# Patient Record
Sex: Female | Born: 1995 | Race: White | Hispanic: No | Marital: Single | Smoking: Never smoker
Health system: Southern US, Community
[De-identification: ages and names within clinical notes are randomized; demographics above are authoritative.]

## PROBLEM LIST (undated history)

## (undated) DIAGNOSIS — M359 Systemic involvement of connective tissue, unspecified: Secondary | ICD-10-CM

## (undated) HISTORY — DX: Systemic involvement of connective tissue, unspecified: M35.9

---
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
## 2018-10-29 NOTE — Progress Notes (Signed)
Office Visit Note  Patient: Tiffany Mercer             Date of Birth: 1996-02-13           MRN: [id number]             PCP: Loraine Leriche., MD Referring: Nickola Major Visit Date: 11/09/2018 Occupation: Senior in college  Subjective:  Positive ANA and joint pain.  History of Present Illness: Tiffany Mercer is a 23 y.o. female seen in consultation per request of her PCP.  According to patient she started having hand stiffness and discomfort in December 2019.  Over time she started having lower back pain and right shoulder joint pain.  She states lower back pain could be worse at time that she has difficulty sleeping and getting up from the chair.  Right shoulder has been hurting off and on sometimes the pain could be severe.  She does not recall any activities which cause the discomfort.  She states she was taking over-the-counter anti-inflammatories initially and then she was taking some prescription anti-inflammatories but it did not help.  She has some recent labs by her PCP which were positive for ANA and she was referred here.  She states her mother has fibromyalgia, Raynauds and Sjogren's syndrome.  She denies any history of oral ulcers, nasal ulcers or photosensitivity.  She states her hands get cold easily and sometimes will turn pale in the colder temperatures.  Activities of Daily Living:  Patient reports morning stiffness for 1 hour.   Patient Reports nocturnal pain. LBP Difficulty dressing/grooming: Denies Difficulty climbing stairs: Denies Difficulty getting out of chair: Reports LBP Difficulty using hands for taps, buttons, cutlery, and/or writing: Denies  Review of Systems  Constitutional: Negative for fatigue, night sweats, weight gain and weight loss.  HENT: Negative for mouth sores, trouble swallowing, trouble swallowing, mouth dryness and nose dryness.   Eyes: Negative for pain, redness, itching, visual disturbance and dryness.  Respiratory:  Negative for cough, shortness of breath, wheezing and difficulty breathing.   Cardiovascular: Negative for chest pain, palpitations, hypertension, irregular heartbeat and swelling in legs/feet.  Gastrointestinal: Negative for abdominal pain, blood in stool, constipation and diarrhea.  Endocrine: Negative for increased urination.  Genitourinary: Negative for painful urination and vaginal dryness.  Musculoskeletal: Positive for arthralgias, joint pain and morning stiffness. Negative for joint swelling, myalgias, muscle weakness, muscle tenderness and myalgias.  Skin: Positive for color change. Negative for rash, hair loss, redness, skin tightness, ulcers and sensitivity to sunlight.  Allergic/Immunologic: Negative for susceptible to infections.  Neurological: Negative for dizziness, light-headedness, headaches, memory loss, night sweats and weakness.  Hematological: Negative for bruising/bleeding tendency and swollen glands.  Psychiatric/Behavioral: Positive for sleep disturbance. Negative for depressed mood and confusion. The patient is nervous/anxious.     PMFS History:  Patient Active Problem List   Diagnosis Date Noted  . History of iron deficiency anemia 11/09/2018  . History of anxiety 11/09/2018    History reviewed. No pertinent past medical history.  Family History  Problem Relation Age of Onset  . Fibromyalgia Mother   . Raynaud syndrome Mother   . Sjogren's syndrome Mother    Past Surgical History:  Procedure Laterality Date  . CHOLECYSTECTOMY  2016  . WISDOM TOOTH EXTRACTION  2018   Social History   Social History Narrative  . Not on file    There is no immunization history on file for this patient.   Objective: Vital Signs: BP 123/88 (  BP Location: Right Arm, Patient Position: Sitting, Cuff Size: Normal)   Pulse 81   Resp 12   Ht 5' 10"  (1.778 m)   Wt 238 lb (108 kg)   BMI 34.15 kg/m    Physical Exam Vitals signs and nursing note reviewed.  Constitutional:       Appearance: She is well-developed.  HENT:     Head: Normocephalic and atraumatic.  Eyes:     Conjunctiva/sclera: Conjunctivae normal.  Neck:     Musculoskeletal: Normal range of motion.  Cardiovascular:     Rate and Rhythm: Normal rate and regular rhythm.     Heart sounds: Normal heart sounds.  Pulmonary:     Effort: Pulmonary effort is normal.     Breath sounds: Normal breath sounds.  Abdominal:     General: Bowel sounds are normal.     Palpations: Abdomen is soft.  Lymphadenopathy:     Cervical: No cervical adenopathy.  Skin:    General: Skin is warm and dry.     Capillary Refill: Capillary refill takes less than 2 seconds.  Neurological:     Mental Status: She is alert and oriented to person, place, and time.  Psychiatric:        Behavior: Behavior normal.      Musculoskeletal Exam: C-spine thoracic and lumbar spine were in good range of motion.  No SI joint tenderness was noted.  She had discomfort with range of motion of her lumbar spine.  She had painful range of motion of her right shoulder joint with full range of motion.  Elbow joints wrist joint MCPs PIPs DIPs been good range of motion with no synovitis or tenderness.  Hip joints, knee joints, ankles MTPs PIPs been good range of motion with no synovitis.  CDAI Exam: CDAI Score: Not documented Patient Global Assessment: Not documented; Provider Global Assessment: Not documented Swollen: Not documented; Tender: Not documented Joint Exam   Not documented   There is currently no information documented on the homunculus. Go to the Rheumatology activity and complete the homunculus joint exam.  Investigation: No additional findings.  Imaging: Xr Hand 2 View Left  Result Date: 11/09/2018 No MCP, PIP or DIP narrowing was noted.  No intercarpal or radiocarpal joint space narrowing was noted.  No erosive changes were noted. Impression: Unremarkable x-ray of the hand.  Xr Hand 2 View Right  Result Date: 11/09/2018  No MCP, PIP or DIP narrowing was noted.  No intercarpal or radiocarpal joint space narrowing was noted.  No erosive changes were noted. Impression: Unremarkable x-ray of the hand.  Xr Lumbar Spine 2-3 Views  Result Date: 11/09/2018 Mild dextroscoliosis was noted.  No SI joint sclerosis was noted.  No disc space narrowing or facet joint arthropathy was noted. Impression: Unremarkable x-ray of the lumbar spine except for mild scoliosis.  Xr Shoulder Right  Result Date: 11/09/2018 No glenohumeral or acromioclavicular joint space narrowing was noted.  No chondrocalcinosis was noted. Impression: Unremarkable x-ray of the shoulder joint.   Recent Labs: No results found for: WBC, HGB, PLT, NA, K, CL, CO2, GLUCOSE, BUN, CREATININE, BILITOT, ALKPHOS, AST, ALT, PROT, ALBUMIN, CALCIUM, GFRAA, QFTBGOLD, QFTBGOLDPLUS  Speciality Comments: No specialty comments available.  Procedures:  No procedures performed Allergies: Patient has no known allergies.   Assessment / Plan:     Visit Diagnoses: Positive ANA (antinuclear antibody) - 08/23/18: RF-, ANA 1:320 speckled, CRP 6.7 -patient complains of pain and stiffness in her hands.  No synovitis was noted today.  She has mild hypermobility in her joints.  She also complains of right shoulder and lower back pain.  She complains of mild Raynaud's symptoms.  I will obtain following labs to evaluate this further.  Is also positive family history of autoimmune disease in her mother.  Plan: CBC with Differential/Platelet, COMPLETE METABOLIC PANEL WITH GFR, Urinalysis, Routine w reflex microscopic, Sedimentation rate, ANA, Anti-scleroderma antibody, RNP Antibody, Anti-Smith antibody, Sjogrens syndrome-B extractable nuclear antibody, Anti-DNA antibody, double-stranded, Sjogrens syndrome-A extractable nuclear antibody, C3 and C4, Beta-2 glycoprotein antibodies, Cardiolipin antibodies, IgG, IgM, IgA, Lupus Anticoagulant Eval w/Reflex  Chronic right shoulder pain -she  complains of severe right shoulder joint discomfort and nocturnal pain in her shoulder.  Plan: XR Shoulder Right.  The x-ray of the shoulder joint was unremarkable.  A handout on shoulder joint exercises was given.  Pain in both hands -she has a stiffness and discomfort in her hands.  No synovitis was noted.  No tenderness was noted on examination today.  Plan: XR Hand 2 View Right, XR Hand 2 View Left.  The x-ray of bilateral hands were unremarkable.  Chronic midline low back pain without sciatica -she complains of lower back pain without any radiculopathy.  She states the pain can be severe to cause nocturnal discomfort.  Plan: XR Lumbar Spine 2-3 Views.  The x-ray showed mild scoliosis otherwise unremarkable.  A handout on back exercises was given.  I will also obtain HLA-B27.  Family history of autoimmune disorder - sjogrens in her mother and rainouts phenomenon.  History of iron deficiency anemia  History of anxiety  Primary insomnia  Other fatigue - Plan: CBC with Differential/Platelet, COMPLETE METABOLIC PANEL WITH GFR, CK, TSH, Glucose 6 phosphate dehydrogenase   Orders: Orders Placed This Encounter  Procedures  . XR Shoulder Right  . XR Lumbar Spine 2-3 Views  . XR Hand 2 View Right  . XR Hand 2 View Left  . CBC with Differential/Platelet  . COMPLETE METABOLIC PANEL WITH GFR  . Urinalysis, Routine w reflex microscopic  . CK  . TSH  . Sedimentation rate  . ANA  . Anti-scleroderma antibody  . RNP Antibody  . Anti-Smith antibody  . Sjogrens syndrome-B extractable nuclear antibody  . Anti-DNA antibody, double-stranded  . Sjogrens syndrome-A extractable nuclear antibody  . C3 and C4  . Beta-2 glycoprotein antibodies  . Cardiolipin antibodies, IgG, IgM, IgA  . Lupus Anticoagulant Eval w/Reflex  . Glucose 6 phosphate dehydrogenase  . HLA-B27 antigen   No orders of the defined types were placed in this encounter.   Face-to-face time spent with patient was 50 minutes.  Greater than 50% of time was spent in counseling and coordination of care.  Follow-Up Instructions: Return for Pain in multiple joints and positive ANA.   Bo Merino, MD  Note - This record has been created using Editor, commissioning.  Chart creation errors have been sought, but may not always  have been located. Such creation errors do not reflect on  the standard of medical care.

## 2018-11-09 ENCOUNTER — Other Ambulatory Visit: Payer: Self-pay

## 2018-11-09 ENCOUNTER — Ambulatory Visit: Payer: Self-pay

## 2018-11-09 ENCOUNTER — Encounter: Payer: Self-pay | Admitting: Rheumatology

## 2018-11-09 ENCOUNTER — Ambulatory Visit: Payer: BLUE CROSS/BLUE SHIELD | Admitting: Rheumatology

## 2018-11-09 ENCOUNTER — Ambulatory Visit (INDEPENDENT_AMBULATORY_CARE_PROVIDER_SITE_OTHER): Payer: BLUE CROSS/BLUE SHIELD

## 2018-11-09 VITALS — BP 123/88 | HR 81 | Resp 12 | Ht 70.0 in | Wt 238.0 lb

## 2018-11-09 DIAGNOSIS — Z832 Family history of diseases of the blood and blood-forming organs and certain disorders involving the immune mechanism: Secondary | ICD-10-CM

## 2018-11-09 DIAGNOSIS — M545 Low back pain, unspecified: Secondary | ICD-10-CM

## 2018-11-09 DIAGNOSIS — Z862 Personal history of diseases of the blood and blood-forming organs and certain disorders involving the immune mechanism: Secondary | ICD-10-CM | POA: Insufficient documentation

## 2018-11-09 DIAGNOSIS — R768 Other specified abnormal immunological findings in serum: Secondary | ICD-10-CM | POA: Diagnosis not present

## 2018-11-09 DIAGNOSIS — M25542 Pain in joints of left hand: Secondary | ICD-10-CM

## 2018-11-09 DIAGNOSIS — M25511 Pain in right shoulder: Secondary | ICD-10-CM | POA: Diagnosis not present

## 2018-11-09 DIAGNOSIS — F5101 Primary insomnia: Secondary | ICD-10-CM

## 2018-11-09 DIAGNOSIS — G8929 Other chronic pain: Secondary | ICD-10-CM | POA: Diagnosis not present

## 2018-11-09 DIAGNOSIS — M79641 Pain in right hand: Secondary | ICD-10-CM | POA: Diagnosis not present

## 2018-11-09 DIAGNOSIS — R5383 Other fatigue: Secondary | ICD-10-CM

## 2018-11-09 DIAGNOSIS — M25541 Pain in joints of right hand: Secondary | ICD-10-CM | POA: Diagnosis not present

## 2018-11-09 DIAGNOSIS — Z8659 Personal history of other mental and behavioral disorders: Secondary | ICD-10-CM | POA: Insufficient documentation

## 2018-11-09 DIAGNOSIS — M79642 Pain in left hand: Secondary | ICD-10-CM

## 2018-11-09 NOTE — Patient Instructions (Signed)
Shoulder Exercises  Ask your health care provider which exercises are safe for you. Do exercises exactly as told by your health care provider and adjust them as directed. It is normal to feel mild stretching, pulling, tightness, or discomfort as you do these exercises, but you should stop right away if you feel sudden pain or your pain gets worse.Do not begin these exercises until told by your health care provider.  Range of Motion Exercises              These exercises warm up your muscles and joints and improve the movement and flexibility of your shoulder. These exercises also help to relieve pain, numbness, and tingling. These exercises involve stretching your injured shoulder directly.  Exercise A: Pendulum  1. Stand near a wall or a surface that you can hold onto for balance.  2. Bend at the waist and let your left / right arm hang straight down. Use your other arm to support you. Keep your back straight and do not lock your knees.  3. Relax your left / right arm and shoulder muscles, and move your hips and your trunk so your left / right arm swings freely. Your arm should swing because of the motion of your body, not because you are using your arm or shoulder muscles.  4. Keep moving your body so your arm swings in the following directions, as told by your health care provider:  ? Side to side.  ? Forward and backward.  ? In clockwise and counterclockwise circles.  5. Continue each motion for __________ seconds, or for as long as told by your health care provider.  6. Slowly return to the starting position.  Repeat __________ times. Complete this exercise __________ times a day.  Exercise B:Flexion, Standing  1. Stand and hold a broomstick, a cane, or a similar object. Place your hands a little more than shoulder-width apart on the object. Your left / right hand should be palm-up, and your other hand should be palm-down.  2. Keep your elbow straight and keep your shoulder muscles relaxed. Push the stick  down with your healthy arm to raise your left / right arm in front of your body, and then over your head until you feel a stretch in your shoulder.  ? Avoid shrugging your shoulder while you raise your arm. Keep your shoulder blade tucked down toward the middle of your back.  3. Hold for __________ seconds.  4. Slowly return to the starting position.  Repeat __________ times. Complete this exercise __________ times a day.  Exercise C: Abduction, Standing  1. Stand and hold a broomstick, a cane, or a similar object. Place your hands a little more than shoulder-width apart on the object. Your left / right hand should be palm-up, and your other hand should be palm-down.  2. While keeping your elbow straight and your shoulder muscles relaxed, push the stick across your body toward your left / right side. Raise your left / right arm to the side of your body and then over your head until you feel a stretch in your shoulder.  ? Do not raise your arm above shoulder height, unless your health care provider tells you to do that.  ? Avoid shrugging your shoulder while you raise your arm. Keep your shoulder blade tucked down toward the middle of your back.  3. Hold for __________ seconds.  4. Slowly return to the starting position.  Repeat __________ times. Complete this exercise __________ times a   day.  Exercise D:Internal Rotation  1. Place your left / right hand behind your back, palm-up.  2. Use your other hand to dangle an exercise band, a towel, or a similar object over your shoulder. Grasp the band with your left / right hand so you are holding onto both ends.  3. Gently pull up on the band until you feel a stretch in the front of your left / right shoulder.  ? Avoid shrugging your shoulder while you raise your arm. Keep your shoulder blade tucked down toward the middle of your back.  4. Hold for __________ seconds.  5. Release the stretch by letting go of the band and lowering your hands.  Repeat __________ times.  Complete this exercise __________ times a day.  Stretching Exercises    These exercises warm up your muscles and joints and improve the movement and flexibility of your shoulder. These exercises also help to relieve pain, numbness, and tingling. These exercises are done using your healthy shoulder to help stretch the muscles of your injured shoulder.  Exercise E: Corner Stretch (External Rotation and Abduction)  1. Stand in a doorway with one of your feet slightly in front of the other. This is called a staggered stance. If you cannot reach your forearms to the door frame, stand facing a corner of a room.  2. Choose one of the following positions as told by your health care provider:  ? Place your hands and forearms on the door frame above your head.  ? Place your hands and forearms on the door frame at the height of your head.  ? Place your hands on the door frame at the height of your elbows.  3. Slowly move your weight onto your front foot until you feel a stretch across your chest and in the front of your shoulders. Keep your head and chest upright and keep your abdominal muscles tight.  4. Hold for __________ seconds.  5. To release the stretch, shift your weight to your back foot.  Repeat __________ times. Complete this stretch __________ times a day.  Exercise F:Extension, Standing  1. Stand and hold a broomstick, a cane, or a similar object behind your back.  ? Your hands should be a little wider than shoulder-width apart.  ? Your palms should face away from your back.  2. Keeping your elbows straight and keeping your shoulder muscles relaxed, move the stick away from your body until you feel a stretch in your shoulder.  ? Avoid shrugging your shoulders while you move the stick. Keep your shoulder blade tucked down toward the middle of your back.  3. Hold for __________ seconds.  4. Slowly return to the starting position.  Repeat __________ times. Complete this exercise __________ times a  day.  Strengthening Exercises                   These exercises build strength and endurance in your shoulder. Endurance is the ability to use your muscles for a long time, even after they get tired.  Exercise G:External Rotation  1. Sit in a stable chair without armrests.  2. Secure an exercise band at elbow height on your left / right side.  3. Place a soft object, such as a folded towel or a small pillow, between your left / right upper arm and your body to move your elbow a few inches away (about 10 cm) from your side.  4. Hold the end of the band so it   is tight and there is no slack.  5. Keeping your elbow pressed against the soft object, move your left / right forearm out, away from your abdomen. Keep your body steady so only your forearm moves.  6. Hold for __________ seconds.  7. Slowly return to the starting position.  Repeat __________ times. Complete this exercise __________ times a day.  Exercise H:Shoulder Abduction  1. Sit in a stable chair without armrests, or stand.  2. Hold a __________ weight in your left / right hand, or hold an exercise band with both hands.  3. Start with your arms straight down and your left / right palm facing in, toward your body.  4. Slowly lift your left / right hand out to your side. Do not lift your hand above shoulder height unless your health care provider tells you that this is safe.  ? Keep your arms straight.  ? Avoid shrugging your shoulder while you do this movement. Keep your shoulder blade tucked down toward the middle of your back.  5. Hold for __________ seconds.  6. Slowly lower your arm, and return to the starting position.  Repeat __________ times. Complete this exercise __________ times a day.  Exercise I:Shoulder Extension  1. Sit in a stable chair without armrests, or stand.  2. Secure an exercise band to a stable object in front of you where it is at shoulder height.  3. Hold one end of the exercise band in each hand. Your palms should face each  other.  4. Straighten your elbows and lift your hands up to shoulder height.  5. Step back, away from the secured end of the exercise band, until the band is tight and there is no slack.  6. Squeeze your shoulder blades together as you pull your hands down to the sides of your thighs. Stop when your hands are straight down by your sides. Do not let your hands go behind your body.  7. Hold for __________ seconds.  8. Slowly return to the starting position.  Repeat __________ times. Complete this exercise __________ times a day.  Exercise J:Standing Shoulder Row  1. Sit in a stable chair without armrests, or stand.  2. Secure an exercise band to a stable object in front of you so it is at waist height.  3. Hold one end of the exercise band in each hand. Your palms should be in a thumbs-up position.  4. Bend each of your elbows to an "L" shape (about 90 degrees) and keep your upper arms at your sides.  5. Step back until the band is tight and there is no slack.  6. Slowly pull your elbows back behind you.  7. Hold for __________ seconds.  8. Slowly return to the starting position.  Repeat __________ times. Complete this exercise __________ times a day.  Exercise K:Shoulder Press-Ups  1. Sit in a stable chair that has armrests. Sit upright, with your feet flat on the floor.  2. Put your hands on the armrests so your elbows are bent and your fingers are pointing forward. Your hands should be about even with the sides of your body.  3. Push down on the armrests and use your arms to lift yourself off of the chair. Straighten your elbows and lift yourself up as much as you comfortably can.  ? Move your shoulder blades down, and avoid letting your shoulders move up toward your ears.  ? Keep your feet on the ground. As you get stronger, your   feet should support less of your body weight as you lift yourself up.  4. Hold for __________ seconds.  5. Slowly lower yourself back into the chair.  Repeat __________ times. Complete  this exercise __________ times a day.  Exercise L: Wall Push-Ups  1. Stand so you are facing a stable wall. Your feet should be about one arm-length away from the wall.  2. Lean forward and place your palms on the wall at shoulder height.  3. Keep your feet flat on the floor as you bend your elbows and lean forward toward the wall.  4. Hold for __________ seconds.  5. Straighten your elbows to push yourself back to the starting position.  Repeat __________ times. Complete this exercise __________ times a day.  This information is not intended to replace advice given to you by your health care provider. Make sure you discuss any questions you have with your health care provider.  Document Released: 04/16/2005 Document Revised: 10/06/2017 Document Reviewed: 02/11/2015  Elsevier Interactive Patient Education  2019 [employer]          Back Exercises  The following exercises strengthen the muscles that help to support the back. They also help to keep the lower back flexible. Doing these exercises can help to prevent back pain or lessen existing pain.  If you have back pain or discomfort, try doing these exercises 2-3 times each day or as told by your health care provider. When the pain goes away, do them once each day, but increase the number of times that you repeat the steps for each exercise (do more repetitions). If you do not have back pain or discomfort, do these exercises once each day or as told by your health care provider.  Exercises  Single Knee to Chest  Repeat these steps 3-5 times for each leg:  1. Lie on your back on a firm bed or the floor with your legs extended.  2. Bring one knee to your chest. Your other leg should stay extended and in contact with the floor.  3. Hold your knee in place by grabbing your knee or thigh.  4. Pull on your knee until you feel a gentle stretch in your lower back.  5. Hold the stretch for 10-30 seconds.  6. Slowly release and straighten your leg.  Pelvic Tilt  Repeat  these steps 5-10 times:  1. Lie on your back on a firm bed or the floor with your legs extended.  2. Bend your knees so they are pointing toward the ceiling and your feet are flat on the floor.  3. Tighten your lower abdominal muscles to press your lower back against the floor. This motion will tilt your pelvis so your tailbone points up toward the ceiling instead of pointing to your feet or the floor.  4. With gentle tension and even breathing, hold this position for 5-10 seconds.  Cat-Cow  Repeat these steps until your lower back becomes more flexible:  1. Get into a hands-and-knees position on a firm surface. Keep your hands under your shoulders, and keep your knees under your hips. You may place padding under your knees for comfort.  2. Let your head hang down, and point your tailbone toward the floor so your lower back becomes rounded like the back of a cat.  3. Hold this position for 5 seconds.  4. Slowly lift your head and point your tailbone up toward the ceiling so your back forms a sagging arch like the back   of a cow.  5. Hold this position for 5 seconds.    Press-Ups  Repeat these steps 5-10 times:  1. Lie on your abdomen (face-down) on the floor.  2. Place your palms near your head, about shoulder-width apart.  3. While you keep your back as relaxed as possible and keep your hips on the floor, slowly straighten your arms to raise the top half of your body and lift your shoulders. Do not use your back muscles to raise your upper torso. You may adjust the placement of your hands to make yourself more comfortable.  4. Hold this position for 5 seconds while you keep your back relaxed.  5. Slowly return to lying flat on the floor.    Bridges  Repeat these steps 10 times:  1. Lie on your back on a firm surface.  2. Bend your knees so they are pointing toward the ceiling and your feet are flat on the floor.  3. Tighten your buttocks muscles and lift your buttocks off of the floor until your waist is at almost  the same height as your knees. You should feel the muscles working in your buttocks and the back of your thighs. If you do not feel these muscles, slide your feet 1-2 inches farther away from your buttocks.  4. Hold this position for 3-5 seconds.  5. Slowly lower your hips to the starting position, and allow your buttocks muscles to relax completely.  If this exercise is too easy, try doing it with your arms crossed over your chest.  Abdominal Crunches  Repeat these steps 5-10 times:  1. Lie on your back on a firm bed or the floor with your legs extended.  2. Bend your knees so they are pointing toward the ceiling and your feet are flat on the floor.  3. Cross your arms over your chest.  4. Tip your chin slightly toward your chest without bending your neck.  5. Tighten your abdominal muscles and slowly raise your trunk (torso) high enough to lift your shoulder blades a tiny bit off of the floor. Avoid raising your torso higher than that, because it can put too much stress on your low back and it does not help to strengthen your abdominal muscles.  6. Slowly return to your starting position.  Back Lifts  Repeat these steps 5-10 times:  1. Lie on your abdomen (face-down) with your arms at your sides, and rest your forehead on the floor.  2. Tighten the muscles in your legs and your buttocks.  3. Slowly lift your chest off of the floor while you keep your hips pressed to the floor. Keep the back of your head in line with the curve in your back. Your eyes should be looking at the floor.  4. Hold this position for 3-5 seconds.  5. Slowly return to your starting position.  Contact a health care provider if:   Your back pain or discomfort gets much worse when you do an exercise.   Your back pain or discomfort does not lessen within 2 hours after you exercise.  If you have any of these problems, stop doing these exercises right away. Do not do them again unless your health care provider says that you can.  Get help right  away if:   You develop sudden, severe back pain. If this happens, stop doing the exercises right away. Do not do them again unless your health care provider says that you can.  This information is   not intended to replace advice given to you by your health care provider. Make sure you discuss any questions you have with your health care provider.  Document Released: 07/10/2004 Document Revised: 10/06/2017 Document Reviewed: 07/27/2014  Elsevier Interactive Patient Education  2019 [employer]

## 2018-11-10 NOTE — Progress Notes (Signed)
We will discuss at the follow-up visit.  Please a schedule an earlier appointment if possible.

## 2018-11-11 NOTE — Progress Notes (Signed)
Please schedule an earlier appointment after all the lab results are available.

## 2018-11-12 LAB — CARDIOLIPIN ANTIBODIES, IGG, IGM, IGA
Anticardiolipin IgA: <11 [APL'U]
Anticardiolipin IgG: <14 [GPL'U]
Anticardiolipin IgM: <12 [MPL'U]

## 2018-11-12 LAB — CBC WITH DIFFERENTIAL/PLATELET
Absolute Monocytes: 420 cells/uL (ref 200–950)
Basophils Absolute: 39 cells/uL (ref 0–200)
Basophils Relative: 0.7 %
Eosinophils Absolute: 168 cells/uL (ref 15–500)
Eosinophils Relative: 3 %
HCT: 40.7 % (ref 35.0–45.0)
Hemoglobin: 13.3 g/dL (ref 11.7–15.5)
Lymphs Abs: 1686 cells/uL (ref 850–3900)
MCH: 28.2 pg (ref 27.0–33.0)
MCHC: 32.7 g/dL (ref 32.0–36.0)
MCV: 86.2 fL (ref 80.0–100.0)
MPV: 10.3 fL (ref 7.5–12.5)
Monocytes Relative: 7.5 %
Neutro Abs: 3287 cells/uL (ref 1500–7800)
Neutrophils Relative %: 58.7 %
Platelets: 277 10*3/uL (ref 140–400)
RBC: 4.72 10*6/uL (ref 3.80–5.10)
RDW: 12.4 % (ref 11.0–15.0)
Total Lymphocyte: 30.1 %
WBC: 5.6 10*3/uL (ref 3.8–10.8)

## 2018-11-12 LAB — COMPLETE METABOLIC PANEL WITH GFR
AG Ratio: 1.6 (calc) (ref 1.0–2.5)
ALT: 22 U/L (ref 6–29)
AST: 16 U/L (ref 10–30)
Albumin: 4.2 g/dL (ref 3.6–5.1)
Alkaline phosphatase (APISO): 65 U/L (ref 31–125)
BUN: 9 mg/dL (ref 7–25)
CO2: 27 mmol/L (ref 20–32)
Calcium: 9 mg/dL (ref 8.6–10.2)
Chloride: 107 mmol/L (ref 98–110)
Creat: 0.86 mg/dL (ref 0.50–1.10)
GFR, Est African American: 112 mL/min/{1.73_m2} (ref >=60)
GFR, Est Non African American: 97 mL/min/{1.73_m2} (ref >=60)
Globulin: 2.7 g/dL (calc) (ref 1.9–3.7)
Glucose, Bld: 88 mg/dL (ref 65–99)
Potassium: 4 mmol/L (ref 3.5–5.3)
Sodium: 140 mmol/L (ref 135–146)
Total Bilirubin: 0.2 mg/dL (ref 0.2–1.2)
Total Protein: 6.9 g/dL (ref 6.1–8.1)

## 2018-11-12 LAB — URINALYSIS, ROUTINE W REFLEX MICROSCOPIC
Bacteria, UA: NONE SEEN /HPF
Bilirubin Urine: NEGATIVE
Glucose, UA: NEGATIVE
Hgb urine dipstick: NEGATIVE
Hyaline Cast: NONE SEEN /LPF
Ketones, ur: NEGATIVE
Leukocytes,Ua: NEGATIVE
Nitrite: NEGATIVE
Specific Gravity, Urine: 1.029 (ref 1.001–1.03)
WBC, UA: NONE SEEN /HPF (ref 0–5)
pH: 5.5 (ref 5.0–8.0)

## 2018-11-12 LAB — ANTI-NUCLEAR AB-TITER (ANA TITER)
ANA TITER: 1:320 {titer} — ABNORMAL HIGH
ANA Titer 1: 1:40 {titer} — ABNORMAL HIGH

## 2018-11-12 LAB — RNP ANTIBODY: Ribonucleic Protein(ENA) Antibody, IgG: 2.5 AI — AB

## 2018-11-12 LAB — RFX DRVVT 1:1 MIX

## 2018-11-12 LAB — BETA-2 GLYCOPROTEIN ANTIBODIES
Beta-2 Glyco 1 IgA: <9 SAU (ref <=20)
Beta-2 Glyco 1 IgM: <9 SMU (ref <=20)
Beta-2 Glyco I IgG: <9 SGU (ref <=20)

## 2018-11-12 LAB — ANA: Anti Nuclear Antibody (ANA): POSITIVE — AB

## 2018-11-12 LAB — RFLX DRVVT CONFRIM: DRVVT CONFIRM: POSITIVE — AB

## 2018-11-12 LAB — RFLX HEXAGONAL PHASE CONFIRM: Hexagonal Phase Conf: NEGATIVE

## 2018-11-12 LAB — SJOGRENS SYNDROME-B EXTRACTABLE NUCLEAR ANTIBODY: SSB (La) (ENA) Antibody, IgG: <1 AI

## 2018-11-12 LAB — SJOGRENS SYNDROME-A EXTRACTABLE NUCLEAR ANTIBODY: SSA (Ro) (ENA) Antibody, IgG: <1 AI

## 2018-11-12 LAB — LUPUS ANTICOAGULANT EVAL W/ REFLEX
PTT-LA Screen: 52 s — ABNORMAL HIGH (ref <=40)
dRVVT: 53 s — ABNORMAL HIGH (ref <=45)

## 2018-11-12 LAB — CK: Total CK: 63 U/L (ref 29–143)

## 2018-11-12 LAB — ANTI-DNA ANTIBODY, DOUBLE-STRANDED: ds DNA Ab: 9 IU/mL — ABNORMAL HIGH

## 2018-11-12 LAB — ANTI-SCLERODERMA ANTIBODY: Scleroderma (Scl-70) (ENA) Antibody, IgG: <1 AI

## 2018-11-12 LAB — SEDIMENTATION RATE: Sed Rate: 25 mm/h — ABNORMAL HIGH (ref 0–20)

## 2018-11-12 LAB — HLA-B27 ANTIGEN: HLA-B27 Antigen: NEGATIVE

## 2018-11-12 LAB — ANTI-SMITH ANTIBODY: ENA SM Ab Ser-aCnc: <1 AI

## 2018-11-12 LAB — TSH: TSH: 1.83 mIU/L

## 2018-11-12 LAB — C3 AND C4
C3 Complement: 146 mg/dL (ref 83–193)
C4 Complement: 27 mg/dL (ref 15–57)

## 2018-11-12 LAB — GLUCOSE 6 PHOSPHATE DEHYDROGENASE: G-6PDH: 16.8 U/g Hgb (ref 7.0–20.5)

## 2018-11-17 ENCOUNTER — Other Ambulatory Visit: Payer: Self-pay

## 2018-11-17 ENCOUNTER — Encounter: Payer: Self-pay | Admitting: Rheumatology

## 2018-11-17 ENCOUNTER — Ambulatory Visit: Payer: BLUE CROSS/BLUE SHIELD | Admitting: Rheumatology

## 2018-11-17 VITALS — BP 121/85 | HR 87 | Resp 13 | Ht 70.0 in | Wt 239.0 lb

## 2018-11-17 DIAGNOSIS — M2550 Pain in unspecified joint: Secondary | ICD-10-CM | POA: Diagnosis not present

## 2018-11-17 DIAGNOSIS — Z832 Family history of diseases of the blood and blood-forming organs and certain disorders involving the immune mechanism: Secondary | ICD-10-CM | POA: Diagnosis not present

## 2018-11-17 DIAGNOSIS — F5101 Primary insomnia: Secondary | ICD-10-CM

## 2018-11-17 DIAGNOSIS — Z862 Personal history of diseases of the blood and blood-forming organs and certain disorders involving the immune mechanism: Secondary | ICD-10-CM

## 2018-11-17 DIAGNOSIS — G8929 Other chronic pain: Secondary | ICD-10-CM

## 2018-11-17 DIAGNOSIS — R5383 Other fatigue: Secondary | ICD-10-CM

## 2018-11-17 DIAGNOSIS — M359 Systemic involvement of connective tissue, unspecified: Secondary | ICD-10-CM

## 2018-11-17 DIAGNOSIS — Z79899 Other long term (current) drug therapy: Secondary | ICD-10-CM

## 2018-11-17 DIAGNOSIS — Z8659 Personal history of other mental and behavioral disorders: Secondary | ICD-10-CM

## 2018-11-17 DIAGNOSIS — M545 Low back pain: Secondary | ICD-10-CM | POA: Diagnosis not present

## 2018-11-17 MED ORDER — HYDROXYCHLOROQUINE SULFATE 200 MG PO TABS: 200.0000 mg | ORAL_TABLET | Freq: Two times a day (BID) | ORAL | 0 refills | Status: DC

## 2018-11-17 NOTE — Patient Instructions (Signed)
Standing Labs We placed an order today for your standing lab work.    Please come back and get your standing labs in 1 month, then 3 months, then every 5 months.  We have open lab daily Monday through Thursday from 8:30-12:30 PM and 1:30-4:30 PM and Friday from 8:30-12:30 PM and 1:30 -4:00 PM at the office of Dr. Shaili Deveshwar.   You may experience shorter wait times on Monday and Friday afternoons. The office is located at [address], [address], [place], [place] [address] No appointment is necessary.   Labs are drawn by [facility].  You may receive a bill from [facility] for your lab work.  If you wish to have your labs drawn at another location, please call the office 24 hours in advance to send orders.  If you have any questions regarding directions or hours of operation,  please call [phone].   Just as a reminder please drink plenty of water prior to coming for your lab work. Thanks!  Hydroxychloroquine tablets What is this medicine? HYDROXYCHLOROQUINE (hye drox ee KLOR oh kwin) is used to treat rheumatoid arthritis and systemic lupus erythematosus. It is also used to treat malaria. This medicine may be used for other purposes; ask your health care provider or pharmacist if you have questions. COMMON BRAND NAME(S): Plaquenil, Quineprox What should I tell my health care provider before I take this medicine? They need to know if you have any of these conditions: -diabetes -eye disease, vision problems -G6PD deficiency -history of blood diseases -history of irregular heartbeat -if you often drink alcohol -kidney disease -liver disease -porphyria -psoriasis -seizures -an unusual or allergic reaction to chloroquine, hydroxychloroquine, other medicines, foods, dyes, or preservatives -pregnant or trying to get pregnant -breast-feeding How should I use this medicine? Take this medicine by mouth with a glass of water. Follow the directions on the prescription label. Avoid  taking antacids within 4 hours of taking this medicine. It is best to separate these medicines by at least 4 hours. Do not cut, crush or chew this medicine. You can take it with or without food. If it upsets your stomach, take it with food. Take your medicine at regular intervals. Do not take your medicine more often than directed. Take all of your medicine as directed even if you think you are better. Do not skip doses or stop your medicine early. Talk to your pediatrician regarding the use of this medicine in children. While this drug may be prescribed for selected conditions, precautions do apply. Overdosage: If you think you have taken too much of this medicine contact a poison control center or emergency room at once. NOTE: This medicine is only for you. Do not share this medicine with others. What if I miss a dose? If you miss a dose, take it as soon as you can. If it is almost time for your next dose, take only that dose. Do not take double or extra doses. What may interact with this medicine? Do not take this medicine with any of the following medications: -cisapride -dofetilide -dronedarone -live virus vaccines -penicillamine -pimozide -thioridazine -ziprasidone This medicine may also interact with the following medications: -ampicillin -antacids -cimetidine -cyclosporine -digoxin -medicines for diabetes, like insulin, glipizide, glyburide -medicines for seizures like carbamazepine, phenobarbital, phenytoin -mefloquine -methotrexate -other medicines that prolong the QT interval (cause an abnormal heart rhythm) -praziquantel This list may not describe all possible interactions. Give your health care provider a list of all the medicines, herbs, non-prescription drugs, or dietary supplements   you use. Also tell them if you smoke, drink alcohol, or use illegal drugs. Some items may interact with your medicine. What should I watch for while using this medicine? Tell your doctor or  healthcare professional if your symptoms do not start to get better or if they get worse. Avoid taking antacids within 4 hours of taking this medicine. It is best to separate these medicines by at least 4 hours. Tell your doctor or health care professional right away if you have any change in your eyesight. Your vision and blood may be tested before and during use of this medicine. This medicine can make you more sensitive to the sun. Keep out of the sun. If you cannot avoid being in the sun, wear protective clothing and use sunscreen. Do not use sun lamps or tanning beds/booths. What side effects may I notice from receiving this medicine? Side effects that you should report to your doctor or health care professional as soon as possible: -allergic reactions like skin rash, itching or hives, swelling of the face, lips, or tongue -changes in vision -decreased hearing or ringing of the ears -redness, blistering, peeling or loosening of the skin, including inside the mouth -seizures -sensitivity to light -signs and symptoms of a dangerous change in heartbeat or heart rhythm like chest pain; dizziness; fast or irregular heartbeat; palpitations; feeling faint or lightheaded, falls; breathing problems -signs and symptoms of liver injury like dark yellow or brown urine; general ill feeling or flu-like symptoms; light-colored stools; loss of appetite; nausea; right upper belly pain; unusually weak or tired; yellowing of the eyes or skin -signs and symptoms of low blood sugar such as feeling anxious; confusion; dizziness; increased hunger; unusually weak or tired; sweating; shakiness; cold; irritable; headache; blurred vision; fast heartbeat; loss of consciousness -uncontrollable head, mouth, neck, arm, or leg movements Side effects that usually do not require medical attention (report to your doctor or health care professional if they continue or are bothersome): -anxious -diarrhea -dizziness -hair  loss -headache -irritable -loss of appetite -nausea, vomiting -stomach pain This list may not describe all possible side effects. Call your doctor for medical advice about side effects. You may report side effects to FDA at 1-800-FDA-1088. Where should I keep my medicine? Keep out of the reach of children. In children, this medicine can cause overdose with small doses. Store at room temperature between 15 and 30 degrees C (59 and 86 degrees F). Protect from moisture and light. Throw away any unused medicine after the expiration date. NOTE: This sheet is a summary. It may not cover all possible information. If you have questions about this medicine, talk to your doctor, pharmacist, or health care provider.  2019 Elsevier/[employer] Standard (2016-01-16 14:16:15)   

## 2018-11-17 NOTE — Progress Notes (Signed)
Pharmacy Note  Subjective: Patient presents today to the [facility] to see Dr. Corliss Skains.  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  She is naive to therapy.  Objective: CMP     Component Value Date/Time   NA 140 11/09/2018 1024   K 4.0 11/09/2018 1024   CL 107 11/09/2018 1024   CO2 27 11/09/2018 1024   GLUCOSE 88 11/09/2018 1024   BUN 9 11/09/2018 1024   CREATININE 0.86 11/09/2018 1024   CALCIUM 9.0 11/09/2018 1024   PROT 6.9 11/09/2018 1024   AST 16 11/09/2018 1024   ALT 22 11/09/2018 1024   BILITOT 0.2 11/09/2018 1024   GFRNONAA 97 11/09/2018 1024   GFRAA 112 11/09/2018 1024    CBC    Component Value Date/Time   WBC 5.6 11/09/2018 1024   RBC 4.72 11/09/2018 1024   HGB 13.3 11/09/2018 1024   HCT 40.7 11/09/2018 1024   PLT 277 11/09/2018 1024   MCV 86.2 11/09/2018 1024   MCH 28.2 11/09/2018 1024   MCHC 32.7 11/09/2018 1024   RDW 12.4 11/09/2018 1024   LYMPHSABS 1,686 11/09/2018 1024   EOSABS 168 11/09/2018 1024   BASOSABS 39 11/09/2018 1024    Assessment/Plan: Patient was counseled on the purpose, proper use, and adverse effects of hydroxychloroquine including nausea/diarrhea, skin rash, headaches, and sun sensitivity.  Discussed importance of annual eye exams while on hydroxychloroquine to monitor to ocular toxicity and discussed importance of frequent laboratory monitoring.  Provided patient with eye exam form for baseline ophthalmologic exam and standing lab instructions.  Provided patient with educational materials on hydroxychloroquine and answered all questions.  Patient consented to hydroxychloroquine.  Will upload consent in the media tab.    Dose will be Plaquenil 200 mg twice daily based on weight of 108 kg. She is to come back for labs in 1 month, 3 months, then every 5 months.  All questions encouraged and answered.  Instructed patient to call with any further questions or concerns.  Verlin Fester, PharmD, [facility] Rheumatology Clinical Pharmacist  11/17/2018 1:35 PM

## 2018-11-17 NOTE — Progress Notes (Signed)
Office Visit Note  Patient: Tiffany Mercer             Date of Birth: January 28, 1996           MRN: [id number]             PCP: Loraine Leriche., MD Referring: Loraine Leriche.,* Visit Date: 11/17/2018 Occupation: _0 @  Subjective:  Pain in multiple joints and Raynauds.   History of Present Illness: Tiffany Mercer is a 23 y.o. female recently evaluated for Raynauds and arthralgias.  She states she continues to have joint pain and stiffness in her hands and her right shoulder.  She also has been experiencing fatigue hair loss and Raynauds phenomenon.  She states the Raynauds has been better during the summer months.  She continues to have some lower back pain.  Activities of Daily Living:  Patient reports morning stiffness for 45 minutes.   Patient Reports nocturnal pain.  Difficulty dressing/grooming: Denies Difficulty climbing stairs: Denies Difficulty getting out of chair: Denies Difficulty using hands for taps, buttons, cutlery, and/or writing: Reports  Review of Systems  Constitutional: Positive for fatigue. Negative for night sweats, weight gain and weight loss.  HENT: Negative for mouth sores, trouble swallowing, trouble swallowing, mouth dryness and nose dryness.   Eyes: Negative for pain, redness, visual disturbance and dryness.  Respiratory: Negative for cough, shortness of breath and difficulty breathing.   Cardiovascular: Negative for chest pain, palpitations, hypertension, irregular heartbeat and swelling in legs/feet.  Gastrointestinal: Negative for blood in stool, constipation and diarrhea.  Endocrine: Negative for increased urination.  Genitourinary: Negative for vaginal dryness.  Musculoskeletal: Positive for arthralgias, joint pain and morning stiffness. Negative for joint swelling, myalgias, muscle weakness, muscle tenderness and myalgias.  Skin: Positive for color change, hair loss and sensitivity to sunlight. Negative for rash, skin  tightness and ulcers.  Allergic/Immunologic: Negative for susceptible to infections.  Neurological: Negative for dizziness, memory loss, night sweats and weakness.  Hematological: Negative for swollen glands.  Psychiatric/Behavioral: Negative for depressed mood and sleep disturbance. The patient is nervous/anxious.     PMFS History:  Patient Active Problem List   Diagnosis Date Noted  . Family history of autoimmune disorder 11/17/2018  . Autoimmune disease ([place]) 11/17/2018  . History of iron deficiency anemia 11/09/2018  . History of anxiety 11/09/2018    History reviewed. No pertinent past medical history.  Family History  Problem Relation Age of Onset  . Fibromyalgia Mother   . Raynaud syndrome Mother   . Sjogren's syndrome Mother    Past Surgical History:  Procedure Laterality Date  . CHOLECYSTECTOMY  2016  . WISDOM TOOTH EXTRACTION  2018   Social History   Social History Narrative  . Not on file    There is no immunization history on file for this patient.   Objective: Vital Signs: BP 121/85 (BP Location: Left Arm, Patient Position: Sitting, Cuff Size: Normal)   Pulse 87   Resp 13   Ht _1  (1.778 m)   Wt 239 lb (108.4 kg)   BMI 34.29 kg/m    Physical Exam Vitals signs and nursing note reviewed.  Constitutional:      Appearance: She is well-developed.  HENT:     Head: Normocephalic and atraumatic.  Eyes:     Conjunctiva/sclera: Conjunctivae normal.  Neck:     Musculoskeletal: Normal range of motion.  Cardiovascular:     Rate and Rhythm: Normal rate and regular rhythm.     Heart sounds:  Normal heart sounds.  Pulmonary:     Effort: Pulmonary effort is normal.     Breath sounds: Normal breath sounds.  Abdominal:     General: Bowel sounds are normal.     Palpations: Abdomen is soft.  Lymphadenopathy:     Cervical: No cervical adenopathy.  Skin:    General: Skin is warm and dry.     Capillary Refill: Capillary refill takes less than 2 seconds.   Neurological:     Mental Status: She is alert and oriented to person, place, and time.  Psychiatric:        Behavior: Behavior normal.      Musculoskeletal Exam: C-spine thoracic and lumbar spine good range of motion.  She has good range of motion of her shoulder joints elbow joints wrist joint MCPs PIPs DIPs with no synovitis.  Hip joints knee joints ankles MTPs PIPs with good range of motion with no synovitis.  CDAI Exam: CDAI Score: Not documented Patient Global Assessment: Not documented; Provider Global Assessment: Not documented Swollen: Not documented; Tender: Not documented Joint Exam   Not documented   There is currently no information documented on the homunculus. Go to the Rheumatology activity and complete the homunculus joint exam.  Investigation: No additional findings.  Imaging: Xr Hand 2 View Left  Result Date: 11/09/2018 No MCP, PIP or DIP narrowing was noted.  No intercarpal or radiocarpal joint space narrowing was noted.  No erosive changes were noted. Impression: Unremarkable x-ray of the hand.  Xr Hand 2 View Right  Result Date: 11/09/2018 No MCP, PIP or DIP narrowing was noted.  No intercarpal or radiocarpal joint space narrowing was noted.  No erosive changes were noted. Impression: Unremarkable x-ray of the hand.  Xr Lumbar Spine 2-3 Views  Result Date: 11/09/2018 Mild dextroscoliosis was noted.  No SI joint sclerosis was noted.  No disc space narrowing or facet joint arthropathy was noted. Impression: Unremarkable x-ray of the lumbar spine except for mild scoliosis.  Xr Shoulder Right  Result Date: 11/09/2018 No glenohumeral or acromioclavicular joint space narrowing was noted.  No chondrocalcinosis was noted. Impression: Unremarkable x-ray of the shoulder joint.   Recent Labs: Lab Results  Component Value Date   WBC 5.6 11/09/2018   HGB 13.3 11/09/2018   PLT 277 11/09/2018   NA 140 11/09/2018   K 4.0 11/09/2018   CL 107 11/09/2018   CO2 27  11/09/2018   GLUCOSE 88 11/09/2018   BUN 9 11/09/2018   CREATININE 0.86 11/09/2018   BILITOT 0.2 11/09/2018   AST 16 11/09/2018   ALT 22 11/09/2018   PROT 6.9 11/09/2018   CALCIUM 9.0 11/09/2018   GFRAA 112 11/09/2018  UA negative, TSH normal, CK normal, ANA 1: 320 speckled, RNP positive, dsDNA positive, (SCL 70, Smith, SSA, SSB negative) C3-C4 normal, anticardiolipin negative, beta-2 GP 1-, lupus anticoagulant negative, HLA-B27 negative, ESR 25, G6PD normal  Speciality Comments: No specialty comments available.  Procedures:  No procedures performed Allergies: Patient has no known allergies.   Assessment / Plan:     Visit Diagnoses: Autoimmune disease ([place]) - ANA 1: 320 speckled, double-stranded DNA positive, RNP positive, C3-C4 normal, history of Raynauds, arthralgias, hair loss and fatigue.  She continues to have arthralgias and fatigue.  She also complains of hair loss for which she has been taking biotin.  Her Raynauds symptoms are better during the summertime.  I had detailed discussion with the patient and her mother who was on her cell phone regarding treatment  options.  After reviewing the indication side effects contraindications they wanted to proceed with Plaquenil.  Handout was given and consent was taken.  We will start her on Plaquenil 200 mg p.o. twice daily.  She will get labs in a month and every 3 months.  She has been advised to get baseline eye examination and then yearly eye examination.  Polyarthralgia - X-ray of right shoulder, bilateral hands were within normal limits at the last visit.  Chronic midline low back pain without sciatica - X-rays were consistent with mild scoliosis only.  Family history of autoimmune disorder - Her mother has Raynauds phenomenon and Sjogren's syndrome.  Other fatigue-probably related to insomnia and autoimmune disease.  Primary insomnia-good sleep hygiene was discussed.  History of anxiety  History of iron deficiency anemia    Orders: Orders Placed This Encounter  Procedures  . CBC with Differential/Platelet  . COMPLETE METABOLIC PANEL WITH GFR   Meds ordered this encounter  Medications  . hydroxychloroquine (PLAQUENIL) 200 MG tablet    Sig: Take 1 tablet (200 mg total) by mouth 2 (two) times daily.    Dispense:  180 tablet    Refill:  0    ICD 10 M35.9 Autoimmune Disease    Face-to-face time spent with patient was 30 minutes. Greater than 50% of time was spent in counseling and coordination of care.  Follow-Up Instructions: Return in about 3 months (around 02/17/2019) for Autoimmune disease.   Bo Merino, MD  Note - This record has been created using Editor, commissioning.  Chart creation errors have been sought, but may not always  have been located. Such creation errors do not reflect on  the standard of medical care.

## 2018-11-24 ENCOUNTER — Telehealth: Payer: Self-pay | Admitting: *Deleted

## 2018-11-24 NOTE — Telephone Encounter (Signed)
Received a fax from [facility] regarding a prior authorization for PLQ. Authorization has been APPROVED from 11/19/2018 to 11/17/2021   Will send document to scan center.

## 2018-12-06 ENCOUNTER — Encounter: Payer: Self-pay | Admitting: Rheumatology

## 2018-12-06 ENCOUNTER — Other Ambulatory Visit: Payer: Self-pay | Admitting: *Deleted

## 2018-12-06 DIAGNOSIS — G8929 Other chronic pain: Secondary | ICD-10-CM

## 2018-12-06 NOTE — Telephone Encounter (Signed)
Per Dr. Estanislado Pandy patient will need to have evaluation of her back. Refer to Dr. Louanne Skye or Dr. Lorin Mercy. Patient advised and referral placed.

## 2018-12-16 ENCOUNTER — Ambulatory Visit: Payer: BLUE CROSS/BLUE SHIELD | Admitting: Rheumatology

## 2018-12-29 ENCOUNTER — Encounter: Payer: Self-pay | Admitting: Rheumatology

## 2018-12-30 NOTE — Telephone Encounter (Signed)
She may consider applying for FMLA.  She will have to be very specific about the number of days she wants every month.

## 2019-01-05 ENCOUNTER — Ambulatory Visit (INDEPENDENT_AMBULATORY_CARE_PROVIDER_SITE_OTHER): Payer: BC Managed Care – PPO | Admitting: Specialist

## 2019-01-05 ENCOUNTER — Other Ambulatory Visit: Payer: Self-pay

## 2019-01-05 ENCOUNTER — Encounter: Payer: Self-pay | Admitting: Specialist

## 2019-01-05 ENCOUNTER — Ambulatory Visit: Payer: BC Managed Care – PPO

## 2019-01-05 VITALS — BP 117/77 | HR 80 | Ht 70.0 in | Wt 238.0 lb

## 2019-01-05 DIAGNOSIS — Z79899 Other long term (current) drug therapy: Secondary | ICD-10-CM

## 2019-01-05 DIAGNOSIS — M545 Low back pain, unspecified: Secondary | ICD-10-CM

## 2019-01-05 DIAGNOSIS — M5136 Other intervertebral disc degeneration, lumbar region: Secondary | ICD-10-CM | POA: Diagnosis not present

## 2019-01-05 DIAGNOSIS — M2170 Unequal limb length (acquired), unspecified site: Secondary | ICD-10-CM | POA: Diagnosis not present

## 2019-01-05 DIAGNOSIS — M533 Sacrococcygeal disorders, not elsewhere classified: Secondary | ICD-10-CM

## 2019-01-05 DIAGNOSIS — M51369 Other intervertebral disc degeneration, lumbar region without mention of lumbar back pain or lower extremity pain: Secondary | ICD-10-CM

## 2019-01-05 MED ORDER — MELOXICAM 15 MG PO TABS: 15.0000 mg | ORAL_TABLET | Freq: Every day | ORAL | 3 refills | Status: DC

## 2019-01-05 NOTE — Patient Instructions (Signed)
Avoid frequent bending and stooping  No lifting greater than 10 lbs. May use ice or moist heat for pain. Weight loss is of benefit. Best medication for lumbar disc disease is arthritis medications like motrin,mobic (meloxicam) celebrex and naprosyn. Exercise is important to improve your indurance and does allow people to function better inspite of back pain. Take meloxicam for 2 weeks then as needed. Try a 9/16th inch heel lift, if the pain improves we will consider a shoe lift.

## 2019-01-05 NOTE — Progress Notes (Signed)
Office Visit Note   Patient: Tiffany Mercer           Date of Birth: 09/06/1995           MRN: [id number] Visit Date: 01/05/2019              Requested by: Tiffany Mercer, Shaili, MD [address] [place] [id number] PCP: Tiffany SchaumannVelazquez, Tiffany Y., MD   Assessment & Plan: Visit Diagnoses:  1. Low back pain, unspecified back pain laterality, unspecified chronicity, unspecified whether sciatica present   2. Leg length discrepancy   3. Degenerative disc disease, lumbar   4. Sacroiliac joint pain     Plan:Avoid frequent bending and stooping  No lifting greater than 10 lbs. May use ice or moist heat for pain. Weight loss is of benefit. Best medication for lumbar disc disease is arthritis medications like motrin,mobic (meloxicam) celebrex and naprosyn. Exercise is important to improve your indurance and does allow people to function better inspite of back pain. Take meloxicam for 2 weeks then as needed. Try a 9/16th inch heel lift, if the pain improves we will consider a shoe lift.     Follow-Up Instructions: Return in about 4 weeks (around 02/02/2019).   Orders:  Orders Placed This Encounter  Procedures  . XR Lumb Spine Flex&Ext Only   Meds ordered this encounter  Medications  . meloxicam (MOBIC) 15 MG tablet    Sig: Take 1 tablet (15 mg total) by mouth daily.    Dispense:  30 tablet    Refill:  3      Procedures: No procedures performed   Clinical Data: No additional findings.   Subjective: Chief Complaint  Patient presents with  . 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 She has a history of back pain since Freshman year in [employer], Studing Music performance. 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 Back pain is central at the lumbosacral junction and radiates to the upper buttocks and the  outer thighs. It is worse With bending, lifting and sitting. Depending on the weight it hurts to lift. She has pain with riding in a car greater than 45 min to an hours. There is  Occasional night pain most nights when she lies down. There is pain with coughing or sneezing. No related to injury, she has noticed a worsening of the Discomfort 1-10 the pain is normally a 6-7 and on some days it is as high as an 8-9. She is sometimes unable to get out of bed due to pain. No pain with commode or  Getting up from bed. Some times she has difficulty fully emptying her bladder. No bowel difficulty. Taking plaquenil for mild autoimmune disease, sees Dr. Corliss Mercer for Rheumatologic condition. Studying for long periods and sitting she has to walk and get up or adjust to improve the pain. No numbness or tingling in the arms or legs. No weakness in the arms or legs. She can walk a mile if hills may be troublesome, and sometimes there is pain with walking. Shopping for long periods with  She has 2 sisters.    Review of Systems  Constitutional: Positive for activity change. Negative for appetite change, chills, diaphoresis, fatigue, fever and unexpected weight change.  HENT: Negative.  Negative for congestion, dental problem, drooling, ear discharge,  ear pain, facial swelling, hearing loss, mouth sores, nosebleeds, postnasal drip, rhinorrhea, sinus pressure, sinus pain, sneezing, sore throat, tinnitus, trouble swallowing and voice change.   Eyes: Negative.  Negative for photophobia, pain, discharge, redness, itching and visual disturbance.  Respiratory: Negative.  Negative for apnea, cough, choking, chest tightness, shortness of breath, wheezing and stridor.   Cardiovascular: Negative.  Negative for chest pain, palpitations and leg swelling.  Gastrointestinal: Negative.  Negative for abdominal distention, abdominal pain, anal bleeding, blood in stool, constipation, diarrhea, nausea, rectal pain and vomiting.   Endocrine: Negative for cold intolerance, heat intolerance, polydipsia, polyphagia and polyuria.  Genitourinary: Negative.  Negative for difficulty urinating, dyspareunia, dysuria, enuresis, flank pain, frequency, hematuria, pelvic pain and urgency.  Musculoskeletal: Positive for back pain. Negative for arthralgias, gait problem, joint swelling, myalgias, neck pain and neck stiffness.  Skin: Negative.  Negative for color change, pallor, rash and wound.  Allergic/Immunologic: Negative for environmental allergies, food allergies and immunocompromised state.  Neurological: Negative for dizziness, tremors, seizures, syncope, facial asymmetry, speech difficulty, weakness, light-headedness, numbness and headaches.  Hematological: Negative for adenopathy. Does not bruise/bleed easily.  Psychiatric/Behavioral: Negative for agitation, behavioral problems, confusion, decreased concentration, dysphoric mood, hallucinations, self-injury, sleep disturbance and suicidal ideas. The patient is not nervous/anxious and is not hyperactive.      Objective: Vital Signs: BP 117/77 (BP Location: Left Arm, Patient Position: Sitting)   Pulse 80   Ht 5\' 10"  (1.778 m)   Wt 238 lb (108 kg)   BMI 34.15 kg/m   Physical Exam Constitutional:      Appearance: She is well-developed.  HENT:     Head: Normocephalic and atraumatic.  Eyes:     Pupils: Pupils are equal, round, and reactive to light.  Neck:     Musculoskeletal: Normal range of motion and neck supple.  Pulmonary:     Effort: Pulmonary effort is normal.     Breath sounds: Normal breath sounds.  Abdominal:     General: Bowel sounds are normal.     Palpations: Abdomen is soft.  Skin:    General: Skin is warm and dry.  Neurological:     Mental Status: She is alert and oriented to person, place, and time.  Psychiatric:        Behavior: Behavior normal.        Thought Content: Thought content normal.        Judgment: Judgment normal.     Back Exam    Tenderness  The patient is experiencing tenderness in the lumbar and sacroiliac.  Range of Motion  Extension: 80  Flexion:  60 abnormal   Muscle Strength  The patient has normal back strength. Right Quadriceps:  5/5  Left Quadriceps:  5/5  Right Hamstrings:  5/5  Left Hamstrings:  5/5   Tests  Straight leg raise right: negative Straight leg raise left: negative  Reflexes  Patellar: 3/4 Achilles: 2/4 Babinski's sign: normal   Other  Toe walk: normal Heel walk: normal Sensation: normal Gait: normal       Specialty Comments:  No specialty comments available.  Imaging: No results found.   PMFS History: Patient Active Problem List   Diagnosis Date Noted  . Family history of autoimmune disorder 11/17/2018  . Autoimmune disease ([facility]) 11/17/2018  . History of iron deficiency anemia 11/09/2018  . History of anxiety 11/09/2018   History reviewed. No pertinent past medical history.  Family History  Problem Relation Age of Onset  . Fibromyalgia Mother   .  Raynaud syndrome Mother   . Sjogren's syndrome Mother     Past Surgical History:  Procedure Laterality Date  . CHOLECYSTECTOMY  2016  . WISDOM TOOTH EXTRACTION  2018   Social History   Occupational History  . Not on file  Tobacco Use  . Smoking status: Never Smoker  . Smokeless tobacco: Never Used  Substance and Sexual Activity  . Alcohol use: Yes    Comment: occ  . Drug use: Never  . Sexual activity: Not on file

## 2019-01-06 LAB — COMPLETE METABOLIC PANEL WITH GFR
AG Ratio: 1.5 (calc) (ref 1.0–2.5)
ALT: 18 U/L (ref 6–29)
AST: 16 U/L (ref 10–30)
Albumin: 4.3 g/dL (ref 3.6–5.1)
Alkaline phosphatase (APISO): 71 U/L (ref 31–125)
BUN: 11 mg/dL (ref 7–25)
CO2: 28 mmol/L (ref 20–32)
Calcium: 9.1 mg/dL (ref 8.6–10.2)
Chloride: 105 mmol/L (ref 98–110)
Creat: 0.8 mg/dL (ref 0.50–1.10)
GFR, Est African American: 122 mL/min/{1.73_m2} (ref >=60)
GFR, Est Non African American: 105 mL/min/{1.73_m2} (ref >=60)
Globulin: 2.8 g/dL (calc) (ref 1.9–3.7)
Glucose, Bld: 80 mg/dL (ref 65–99)
Potassium: 4.2 mmol/L (ref 3.5–5.3)
Sodium: 140 mmol/L (ref 135–146)
Total Bilirubin: 0.3 mg/dL (ref 0.2–1.2)
Total Protein: 7.1 g/dL (ref 6.1–8.1)

## 2019-01-06 LAB — CBC WITH DIFFERENTIAL/PLATELET
Absolute Monocytes: 347 cells/uL (ref 200–950)
Basophils Absolute: 39 cells/uL (ref 0–200)
Basophils Relative: 0.7 %
Eosinophils Absolute: 118 cells/uL (ref 15–500)
Eosinophils Relative: 2.1 %
HCT: 39.7 % (ref 35.0–45.0)
Hemoglobin: 12.9 g/dL (ref 11.7–15.5)
Lymphs Abs: 1714 cells/uL (ref 850–3900)
MCH: 27.5 pg (ref 27.0–33.0)
MCHC: 32.5 g/dL (ref 32.0–36.0)
MCV: 84.6 fL (ref 80.0–100.0)
MPV: 9.8 fL (ref 7.5–12.5)
Monocytes Relative: 6.2 %
Neutro Abs: 3382 cells/uL (ref 1500–7800)
Neutrophils Relative %: 60.4 %
Platelets: 279 10*3/uL (ref 140–400)
RBC: 4.69 10*6/uL (ref 3.80–5.10)
RDW: 12.2 % (ref 11.0–15.0)
Total Lymphocyte: 30.6 %
WBC: 5.6 10*3/uL (ref 3.8–10.8)

## 2019-01-07 ENCOUNTER — Encounter: Payer: Self-pay | Admitting: Rheumatology

## 2019-02-04 NOTE — Progress Notes (Signed)
Office Visit Note  Patient: Tiffany Mercer             Date of Birth: 09/10/1995           MRN: [id number]             PCP: Cheron SchaumannVelazquez, Gretchen Y., MD Referring: Cheron SchaumannVelazquez, Gretchen Y.,* Visit Date: 02/18/2019 Occupation: @GUAROCC @  Subjective:  Pain in multiple joints    History of Present Illness: Tiffany Mercer is a 23 y.o. female with history of autoimmune disease.  Patient was started on Plaquenil 200 mg 1 tablet by mouth twice daily in June 2020.  She has been tolerating Plaquenil without any side effects.  She has not noticed any improvement in the joint pain she has been experiencing.  She continues to have pain in bilateral hands, the right shoulder, and lower back.  She denies any joint swelling.  She is been taking Tylenol as needed for pain relief.  She was evaluated by Dr. Otelia SergeantNitka on 01/05/19 who obtained x-rays of the lumbar spine and started her on meloxicam 15 mg 1 tablet daily for 2 weeks and now she is taking it as needed. She noticed minimal improvement while taking meloxicam.  She denies any recent rashes.  She denies any sores in her mouth or nose.  She denies any sicca symptoms.  She continues to have photosensitivity and tries to avoid the sunlight.  She denies any hair loss but continues to take biotin.  She has chronic fatigue but denies any fevers or swollen lymph nodes.  She denies any shortness of breath or palpitations.  She continues to have intermittent symptoms of Raynaud's in her fingers but states that her symptoms have been less frequent during the summer. She has a Plaquenil eye exam scheduled in December 2020.   Activities of Daily Living:  Patient reports morning stiffness for 30 minutes.   Patient Reports nocturnal pain.  Difficulty dressing/grooming: Denies Difficulty climbing stairs: Denies Difficulty getting out of chair: Denies Difficulty using hands for taps, buttons, cutlery, and/or writing: Denies  Review of Systems  Constitutional:  Positive for fatigue.  HENT: Negative for mouth sores, trouble swallowing, trouble swallowing, mouth dryness and nose dryness.   Eyes: Negative for pain, itching, visual disturbance and dryness.  Respiratory: Negative for cough, hemoptysis, shortness of breath, wheezing and difficulty breathing.   Cardiovascular: Negative for chest pain, palpitations, hypertension and swelling in legs/feet.  Gastrointestinal: Negative for blood in stool, constipation and diarrhea.  Endocrine: Negative for increased urination.  Genitourinary: Negative for difficulty urinating and painful urination.  Musculoskeletal: Positive for arthralgias, joint pain and morning stiffness. Negative for joint swelling, myalgias, muscle weakness, muscle tenderness and myalgias.  Skin: Negative for color change, pallor, rash, hair loss, nodules/bumps, redness, skin tightness, ulcers and sensitivity to sunlight.  Allergic/Immunologic: Negative for susceptible to infections.  Neurological: Negative for dizziness, numbness, headaches, memory loss and weakness.  Hematological: Negative for swollen glands.  Psychiatric/Behavioral: Positive for sleep disturbance. Negative for depressed mood and confusion. The patient is not nervous/anxious.     PMFS History:  Patient Active Problem List   Diagnosis Date Noted   Family history of autoimmune disorder 11/17/2018   Autoimmune disease (HCC) 11/17/2018   History of iron deficiency anemia 11/09/2018   History of anxiety 11/09/2018    History reviewed. No pertinent past medical history.  Family History  Problem Relation Age of Onset   Fibromyalgia Mother    Raynaud syndrome Mother    Sjogren's syndrome Mother  Past Surgical History:  Procedure Laterality Date   CHOLECYSTECTOMY  2016   WISDOM TOOTH EXTRACTION  2018   Social History   Social History Narrative   Not on file    There is no immunization history on file for this patient.   Objective: Vital Signs:  BP 114/74 (BP Location: Right Arm, Patient Position: Sitting, Cuff Size: Large)    Pulse 77    Resp 14    Ht 5\' 10"  (1.778 m)    Wt 244 lb 9.6 oz (110.9 kg)    BMI 35.10 kg/m    Physical Exam Vitals signs and nursing note reviewed.  Constitutional:      Appearance: She is well-developed.  HENT:     Head: Normocephalic and atraumatic.  Eyes:     Conjunctiva/sclera: Conjunctivae normal.  Neck:     Musculoskeletal: Normal range of motion.  Cardiovascular:     Rate and Rhythm: Normal rate and regular rhythm.     Heart sounds: Normal heart sounds.  Pulmonary:     Effort: Pulmonary effort is normal.     Breath sounds: Normal breath sounds.  Abdominal:     General: Bowel sounds are normal.     Palpations: Abdomen is soft.  Lymphadenopathy:     Cervical: No cervical adenopathy.  Skin:    General: Skin is warm and dry.     Capillary Refill: Capillary refill takes less than 2 seconds.  Neurological:     Mental Status: She is alert and oriented to person, place, and time.  Psychiatric:        Behavior: Behavior normal.      Musculoskeletal Exam: C-spine, thoracic spine, lumbar spine good range of motion.  Midline spinal tenderness in the lumbar region.  No SI joint tenderness.  Shoulder joints, elbow joints, wrist joints, MCPs and PIPs and DIPs good range of motion with no synovitis.  She has complete fist formation bilaterally.  Hip joints, knee joints and ankle joints, MTPs, PIPs and DIPs good range of motion no synovitis.  No warmth or effusion of bilateral knee joints.  No tenderness or swelling of ankle joints.  CDAI Exam: CDAI Score: -- Patient Global: --; Provider Global: -- Swollen: --; Tender: -- Joint Exam   No joint exam has been documented for this visit   There is currently no information documented on the homunculus. Go to the Rheumatology activity and complete the homunculus joint exam.  Investigation: No additional findings.  Imaging: No results found.  Recent  Labs: Lab Results  Component Value Date   WBC 5.6 01/05/2019   HGB 12.9 01/05/2019   PLT 279 01/05/2019   NA 140 01/05/2019   K 4.2 01/05/2019   CL 105 01/05/2019   CO2 28 01/05/2019   GLUCOSE 80 01/05/2019   BUN 11 01/05/2019   CREATININE 0.80 01/05/2019   BILITOT 0.3 01/05/2019   AST 16 01/05/2019   ALT 18 01/05/2019   PROT 7.1 01/05/2019   CALCIUM 9.1 01/05/2019   GFRAA 122 01/05/2019    Speciality Comments: No specialty comments available.  Procedures:  No procedures performed Allergies: Patient has no known allergies.   Assessment / Plan:     Visit Diagnoses: Autoimmune disease (HCC) - ANA 1: 320 speckled, double-stranded DNA positive, RNP positive, C3-C4 normal, history of Raynauds, arthralgias, hair loss and fatigue: She presents today with ongoing arthralgias, fatigue, and intermittent symptoms of Raynaud's.  She has pain in bilateral hands, the right shoulder, and lower back.  She has  no synovitis on exam.  She has good range of motion of the right shoulder.  She does have midline spinal tenderness in the lumbar region.  X-rays of the right shoulder, bilateral hands, and lumbar spine were obtained on 11/09/2018 which were unremarkable.  She was started on Plaquenil 200 mg 1 tablet by mouth twice daily in June 2020.  She has been tolerating Plaquenil without any side effects.  She has not noticed any improvement in her arthralgias since starting on Plaquenil.  She continues to have chronic fatigue but it has been stable.  Her symptoms of Raynaud's have been infrequent.  No digital ulcerations or signs of gangrene were noted.  She has not had any oral or nasal ulcerations.  She has not had any sicca symptoms.  She denies any hair loss and continues to take biotin daily.  She denies any shortness of breath or palpitations.  She has chronic fatigue but has not had any fevers or swollen lymph nodes.  She will continue taking Plaquenil 200 mg 1 tablet by mouth twice daily.  We will  obtain lab work in October.  Future orders for autoimmune labs were placed today.  She will return to the office in 3 months.  She was advised to notify us if she develops any new or worsening symptoms.- Plan: CBC with Differential/Platelet, COMPLETE METABOLIC PANEL WITH GFR, Urinalysis, Routine w reflex microscopic, Anti-DNA antibody, double-stranded, C3 and C4, Sedimentation rate  High risk medication use -She will return for lab work in October and every 5 months.  Future orders for CBC and CMP were placed today.  She has a baseline Plaquenil eye exam scheduled in December 2020.  Plan: CBC with Differential/Platelet, COMPLETE METABOLIC PANEL WITH GFR  Other fatigue: Chronic but stable.   Primary insomnia: She has difficulty sleeping at night due to nocturnal pain.   Other medical conditions are listed as follows:   Family history of autoimmune disorder  History of anxiety  History of iron deficiency anemia  Orders: Orders Placed This Encounter  Procedures   CBC with Differential/Platelet   COMPLETE METABOLIC PANEL WITH GFR   Urinalysis, Routine w reflex microscopic   Anti-DNA antibody, double-stranded   C3 and C4   Sedimentation rate   No orders of the defined types were placed in this encounter.     Follow-Up Instructions: Return in about 3 months (around 05/20/2019) for Autoimmune Disease.   Ofilia Neas, PA-C  Note - This record has been created using Dragon software.  Chart creation errors have been sought, but may not always  have been located. Such creation errors do not reflect on  the standard of medical care.

## 2019-02-15 ENCOUNTER — Encounter: Payer: Self-pay | Admitting: *Deleted

## 2019-02-15 ENCOUNTER — Other Ambulatory Visit: Payer: Self-pay | Admitting: Rheumatology

## 2019-02-15 DIAGNOSIS — M359 Systemic involvement of connective tissue, unspecified: Secondary | ICD-10-CM

## 2019-02-15 NOTE — Telephone Encounter (Signed)
Ok to refill 30-day supply

## 2019-02-15 NOTE — Telephone Encounter (Signed)
Last Visit: 11/17/18 Next Visit: 02/18/19 Labs: 01/05/19 WNL Eye exam: no baseline PLQ eye exam  Patient advised via my chart that we need PLQ eye exam.  Okay to refill Plaquenil?

## 2019-02-17 ENCOUNTER — Ambulatory Visit: Payer: BC Managed Care – PPO | Admitting: Physician Assistant

## 2019-02-18 ENCOUNTER — Ambulatory Visit: Payer: BC Managed Care – PPO | Admitting: Specialist

## 2019-02-18 ENCOUNTER — Ambulatory Visit: Payer: BC Managed Care – PPO | Admitting: Physician Assistant

## 2019-02-18 ENCOUNTER — Encounter: Payer: Self-pay | Admitting: Physician Assistant

## 2019-02-18 ENCOUNTER — Other Ambulatory Visit: Payer: Self-pay

## 2019-02-18 VITALS — BP 114/74 | HR 77 | Resp 14 | Ht 70.0 in | Wt 244.6 lb

## 2019-02-18 DIAGNOSIS — M359 Systemic involvement of connective tissue, unspecified: Secondary | ICD-10-CM

## 2019-02-18 DIAGNOSIS — F5101 Primary insomnia: Secondary | ICD-10-CM | POA: Diagnosis not present

## 2019-02-18 DIAGNOSIS — Z862 Personal history of diseases of the blood and blood-forming organs and certain disorders involving the immune mechanism: Secondary | ICD-10-CM

## 2019-02-18 DIAGNOSIS — Z8659 Personal history of other mental and behavioral disorders: Secondary | ICD-10-CM

## 2019-02-18 DIAGNOSIS — Z79899 Other long term (current) drug therapy: Secondary | ICD-10-CM | POA: Diagnosis not present

## 2019-02-18 DIAGNOSIS — R5383 Other fatigue: Secondary | ICD-10-CM | POA: Diagnosis not present

## 2019-02-18 DIAGNOSIS — Z832 Family history of diseases of the blood and blood-forming organs and certain disorders involving the immune mechanism: Secondary | ICD-10-CM

## 2019-02-18 NOTE — Patient Instructions (Signed)
Standing Labs We placed an order today for your standing lab work.    Please come back and get your standing labs in October   We have open lab daily Monday through Thursday from 8:30-12:30 PM and 1:30-4:30 PM and Friday from 8:30-12:30 PM and 1:30 -4:00 PM at the office of Dr. Bo Merino.   You may experience shorter wait times on Monday and Friday afternoons. The office is located at [address] [place], [place], [place] [address] No appointment is necessary.   Labs are drawn by [facility].  You may receive a bill from [facility] for your lab work.  If you wish to have your labs drawn at another location, please call the office 24 hours in advance to send orders.  If you have any questions regarding directions or hours of operation,  please call [phone].   Just as a reminder please drink plenty of water prior to coming for your lab work. Thanks!

## 2019-03-12 ENCOUNTER — Other Ambulatory Visit: Payer: Self-pay | Admitting: Rheumatology

## 2019-03-12 DIAGNOSIS — M359 Systemic involvement of connective tissue, unspecified: Secondary | ICD-10-CM

## 2019-03-14 ENCOUNTER — Telehealth: Payer: Self-pay | Admitting: Rheumatology

## 2019-03-14 NOTE — Telephone Encounter (Signed)
Received PLQ Eye Exam and documented in the chart.

## 2019-03-14 NOTE — Telephone Encounter (Signed)
Last Visit: 02/18/19 Next Visit: 05/20/19 Labs: 01/05/19 WNL Eye exam: no baseline PLQ eye exam on file. Started 11/17/18  Left message to advise patient we need PLQ eye Exam  Okay to refill 30 day supply PLQ?

## 2019-03-14 NOTE — Telephone Encounter (Signed)
Patient left a voicemail stating "her eye doctor is faxing the Plaquenil eye exam form to Dr. Estanislado Pandy" in order for her to refill her prescription.

## 2019-03-14 NOTE — Telephone Encounter (Signed)
Ok to refill 30-day supply

## 2019-03-14 NOTE — Telephone Encounter (Signed)
Received PLQ eye exam done on 01/17/19 WNL

## 2019-04-05 ENCOUNTER — Other Ambulatory Visit: Payer: Self-pay | Admitting: Rheumatology

## 2019-04-05 DIAGNOSIS — M359 Systemic involvement of connective tissue, unspecified: Secondary | ICD-10-CM

## 2019-04-05 NOTE — Telephone Encounter (Signed)
Last Visit: 02/18/19 Next Visit: 05/20/19 Labs: 01/05/19 WNL Eye exam: 01/17/19 WNL  Okay to refill per Dr. Estanislado Pandy

## 2019-05-06 NOTE — Progress Notes (Signed)
Virtual Visit via Telephone Note  I connected with Tiffany Mercer on 05/20/19 at  9:20 AM EST by a video enabled telemedicine application and verified that I am speaking with the correct person using two identifiers.  Location: Patient: Home  Provider: Clinic  This service was conducted via virtual visit. The patient was located at home. I was located in my office.  Consent was obtained prior to the virtual visit and is aware of possible charges through their insurance for this visit.  The patient is an established patient.  Dr. Estanislado Pandy, MD conducted the virtual visit and Hazel Sams, PA-C acted as scribe during the service.  Office staff helped with scheduling follow up visits after the service was conducted.     I discussed the limitations of evaluation and management by telemedicine and the availability of in person appointments. The patient expressed understanding and agreed to proceed.  CC: Medication monitoring  History of Present Illness: Patient is a 23 year old female with a past medical history of autoimmune disease.  She is taking plaquenil 200 mg 1 tablet by mouth twice daily.  She has noticed improvement since starting on PLQ.  She denies any joint pain or joint swelling.  She exercises on a regular basis. She continues to have morning stiffness for 10-15 minutes.  She denies any oral or nasal ulcerations. She denies any mouth dryness but continues to have eye dryness. She has not had any recent rashes or raynaud's.  She denies any enlarged lymph nodes or fevers recently.  Her hair loss has improved.    Review of Systems  Constitutional: Negative for fever and malaise/fatigue.  Eyes: Negative for photophobia, pain, discharge and redness.       +Dry eyes  Respiratory: Negative for cough, shortness of breath and wheezing.   Cardiovascular: Negative for chest pain and palpitations.  Gastrointestinal: Negative for blood in stool, constipation and diarrhea.  Genitourinary:  Negative for dysuria.  Musculoskeletal: Negative for back pain, joint pain, myalgias and neck pain.       + Morning stiffness   Skin: Negative for rash.  Neurological: Negative for dizziness and headaches.  Psychiatric/Behavioral: Negative for depression. The patient is not nervous/anxious and does not have insomnia.       Observations/Objective: Physical Exam  Constitutional: She is oriented to person, place, and time.  Neurological: She is alert and oriented to person, place, and time.  Psychiatric: Mood, memory, affect and judgment normal.     Patient reports morning stiffness for 10-15  minutes.   Patient denies nocturnal pain.  Difficulty dressing/grooming: Denies Difficulty climbing stairs: Denies Difficulty getting out of chair: Denies Difficulty using hands for taps, buttons, cutlery, and/or writing: Denies   Assessment and Plan: Visit Diagnoses: Autoimmune disease ([facility]) - ANA 1: 320 speckled, double-stranded DNA positive, RNP positive, C3-C4 normal, history of Raynauds, arthralgias, hair loss and fatigue, X-rays of the right shoulder, bilateral hands, and lumbar spine were obtained on 11/09/2018 which were unremarkable: She has not had any signs or symptoms of a flare recently. She was started on Plaquenil 200 mg 1 tablet by mouth twice daily in June 2020 and has noticed significant improvement in her symptoms.  She is not having any joint pain or joint swelling at this time.  She continues to have morning stiffness for about 10-15 minutes but her nocturnal pain has improved.  She is exercising on a regular basis.  She has not had any recent rashes, photosensitivity, or hair loss.  She  wears sunscreen daily.  She denies any oral or nasal ulcerations.  She has chronic eye dryness but no mouth dryness.  Her level of fatigue has improve and she has not had any fatigue or enlarged lymph nodes.  She will continue taking plaquenil as prescribed.  She does not need any refills.  She is due  to update autoimmune lab work.  Orders are in place.  She was advised to notify us if she develops increased joint pain or joint swelling.  She will follow up in 5 months.   High risk medication use -Plaquenil 200 mg 1 tablet by mouth twice daily. PLQ Eye Exam: 01/17/19 WNL [facility].  CBC and CMP were drawn on 01/05/19.  She is due to update lab work.  Future orders are in place.   Other fatigue: Her level of fatigue has improved.    Primary insomnia: She has been sleeping better at night.  She is no longer experiencing nocturnal pain.  Other medical conditions are listed as follows:   Family history of autoimmune disorder  History of anxiety  History of iron deficiency anemia  Follow Up Instructions: She will follow up in 5 months.   I discussed the assessment and treatment plan with the patient. The patient was provided an opportunity to ask questions and all were answered. The patient agreed with the plan and demonstrated an understanding of the instructions.   The patient was advised to call back or seek an in-person evaluation if the symptoms worsen or if the condition fails to improve as anticipated.  I provided 15 minutes of non-face-to-face time during this encounter.   Pollyann Savoy, MD   Scribed by-  Sherron Ales, PA-C

## 2019-05-20 ENCOUNTER — Encounter: Payer: Self-pay | Admitting: Rheumatology

## 2019-05-20 ENCOUNTER — Other Ambulatory Visit: Payer: Self-pay

## 2019-05-20 ENCOUNTER — Telehealth (INDEPENDENT_AMBULATORY_CARE_PROVIDER_SITE_OTHER): Payer: BC Managed Care – PPO | Admitting: Rheumatology

## 2019-05-20 DIAGNOSIS — Z832 Family history of diseases of the blood and blood-forming organs and certain disorders involving the immune mechanism: Secondary | ICD-10-CM

## 2019-05-20 DIAGNOSIS — R5383 Other fatigue: Secondary | ICD-10-CM

## 2019-05-20 DIAGNOSIS — Z8659 Personal history of other mental and behavioral disorders: Secondary | ICD-10-CM

## 2019-05-20 DIAGNOSIS — M359 Systemic involvement of connective tissue, unspecified: Secondary | ICD-10-CM

## 2019-05-20 DIAGNOSIS — Z79899 Other long term (current) drug therapy: Secondary | ICD-10-CM | POA: Diagnosis not present

## 2019-05-20 DIAGNOSIS — Z862 Personal history of diseases of the blood and blood-forming organs and certain disorders involving the immune mechanism: Secondary | ICD-10-CM

## 2019-05-20 DIAGNOSIS — F5101 Primary insomnia: Secondary | ICD-10-CM | POA: Diagnosis not present

## 2019-06-28 ENCOUNTER — Other Ambulatory Visit: Payer: Self-pay | Admitting: Rheumatology

## 2019-06-28 DIAGNOSIS — M359 Systemic involvement of connective tissue, unspecified: Secondary | ICD-10-CM

## 2019-06-28 NOTE — Telephone Encounter (Signed)
Ok to refill 30-day supply

## 2019-06-28 NOTE — Telephone Encounter (Signed)
Last Visit: 05/20/19 Next Visit: 11/04/19 Labs: 01/05/19 WNL PLQ Eye Exam: 01/17/19 WNL   Patient advised she is due to update labs. Patient states she is away at school. Patient will find out which lab is close by and call back so we may release orders for her to update.  Okay to refill 30 day supply PLQ?

## 2019-08-22 ENCOUNTER — Encounter: Payer: Self-pay | Admitting: Rheumatology

## 2019-08-22 DIAGNOSIS — M359 Systemic involvement of connective tissue, unspecified: Secondary | ICD-10-CM

## 2019-08-23 MED ORDER — HYDROXYCHLOROQUINE SULFATE 200 MG PO TABS: 200.0000 mg | ORAL_TABLET | Freq: Two times a day (BID) | ORAL | 0 refills | Status: DC

## 2019-08-23 NOTE — Telephone Encounter (Signed)
ok 

## 2019-08-23 NOTE — Telephone Encounter (Signed)
Last Visit: 05/20/19 Next Visit: 11/04/19 Labs: 01/05/19 WNL Eye exam: 01/17/19 WNL    Patient recently tested positive for Covid. Patient states she will update labs once she feels better.     Okay to refill 30 day supply Plaquenil?

## 2019-09-16 ENCOUNTER — Other Ambulatory Visit: Payer: Self-pay | Admitting: Rheumatology

## 2019-09-16 DIAGNOSIS — M359 Systemic involvement of connective tissue, unspecified: Secondary | ICD-10-CM

## 2019-09-19 ENCOUNTER — Other Ambulatory Visit: Payer: Self-pay | Admitting: *Deleted

## 2019-09-19 DIAGNOSIS — Z79899 Other long term (current) drug therapy: Secondary | ICD-10-CM

## 2019-09-19 NOTE — Telephone Encounter (Signed)
We can refill PLQ once lab work has resulted.

## 2019-09-19 NOTE — Telephone Encounter (Addendum)
Last Visit: 05/20/19 Next Visit: 11/04/19 Labs: 01/05/19 WNL PLQ Eye Exam: 01/17/19 WNL   Patient advised she is due to update labs. Patient states she is just now out of quarantine and will be able to update labs this week. Lab orders released.    Okay to refill 30 day supply PLQ?

## 2019-09-22 NOTE — Telephone Encounter (Signed)
Attempted to contact patient and left message for patient to call the office.  

## 2019-09-23 LAB — CMP14+EGFR
ALT: 25 IU/L (ref 0–32)
AST: 50 IU/L — ABNORMAL HIGH (ref 0–40)
Albumin/Globulin Ratio: 1.4 (ref 1.2–2.2)
Albumin: 4.2 g/dL (ref 3.9–5.0)
Alkaline Phosphatase: 68 IU/L (ref 39–117)
BUN/Creatinine Ratio: 14 (ref 9–23)
BUN: 11 mg/dL (ref 6–20)
Bilirubin Total: 0.3 mg/dL (ref 0.0–1.2)
CO2: 22 mmol/L (ref 20–29)
Calcium: 9.3 mg/dL (ref 8.7–10.2)
Chloride: 104 mmol/L (ref 96–106)
Creatinine, Ser: 0.76 mg/dL (ref 0.57–1.00)
GFR calc Af Amer: 129 mL/min/{1.73_m2} (ref >59)
GFR calc non Af Amer: 112 mL/min/{1.73_m2} (ref >59)
Globulin, Total: 3 g/dL (ref 1.5–4.5)
Glucose: 84 mg/dL (ref 65–99)
Potassium: 4.4 mmol/L (ref 3.5–5.2)
Sodium: 140 mmol/L (ref 134–144)
Total Protein: 7.2 g/dL (ref 6.0–8.5)

## 2019-09-23 LAB — CBC WITH DIFFERENTIAL/PLATELET
Basophils Absolute: 0 10*3/uL (ref 0.0–0.2)
Basos: 1 %
EOS (ABSOLUTE): 0.1 10*3/uL (ref 0.0–0.4)
Eos: 2 %
Hematocrit: 40.9 % (ref 34.0–46.6)
Hemoglobin: 13.4 g/dL (ref 11.1–15.9)
Immature Grans (Abs): 0 10*3/uL (ref 0.0–0.1)
Immature Granulocytes: 0 %
Lymphocytes Absolute: 2 10*3/uL (ref 0.7–3.1)
Lymphs: 31 %
MCH: 29.1 pg (ref 26.6–33.0)
MCHC: 32.8 g/dL (ref 31.5–35.7)
MCV: 89 fL (ref 79–97)
Monocytes Absolute: 0.5 10*3/uL (ref 0.1–0.9)
Monocytes: 8 %
Neutrophils Absolute: 3.7 10*3/uL (ref 1.4–7.0)
Neutrophils: 58 %
Platelets: 267 10*3/uL (ref 150–450)
RBC: 4.61 x10E6/uL (ref 3.77–5.28)
RDW: 13.6 % (ref 11.7–15.4)
WBC: 6.4 10*3/uL (ref 3.4–10.8)

## 2019-09-23 NOTE — Progress Notes (Signed)
Elevated LFTs noted.  Most likely due to use of meloxicam.  Patient should discontinue meloxicam.  She should repeat LFTs in 1 month after stopping the medication.

## 2019-09-23 NOTE — Telephone Encounter (Signed)
Lab received.

## 2019-09-27 ENCOUNTER — Encounter: Payer: Self-pay | Admitting: *Deleted

## 2019-10-26 NOTE — Progress Notes (Signed)
Office Visit Note  Patient: Tiffany Mercer             Date of Birth: 19-Apr-1996           MRN: [id number]             PCP: Cheron Schaumann., MD Referring: Cheron Schaumann.,* Visit Date: 11/04/2019 Occupation: @GUAROCC @  Subjective:  Medication monitoring   History of Present Illness: Emmarie Sannes is a 24 y.o. female with history of autoimmune disease.  Patient is taking Plaquenil 200 mg 1 tablet by mouth twice daily. She continues to have pain in both hands but denies any joint swelling.  She denies any recent rashes.  She has ongoing photosensitivity and wears sunscreen daily.  She denies any symptoms of Raynaud's recently.  She denies any oral or nasal ulcerations or sicca symptoms. She denies any shortness of breath, chest pain, or palpitations.  She has chronic fatigue which has been stable. She denies any swollen lymph nodes recently.   Activities of Daily Living:  Patient reports morning stiffness for 1 hour.   Patient Reports nocturnal pain.  Difficulty dressing/grooming: Denies Difficulty climbing stairs: Denies Difficulty getting out of chair: Denies Difficulty using hands for taps, buttons, cutlery, and/or writing: Denies  Review of Systems  Constitutional: Positive for fatigue.  HENT: Negative for mouth sores, mouth dryness and nose dryness.   Eyes: Negative for pain, visual disturbance and dryness.  Respiratory: Negative for cough, hemoptysis, shortness of breath and difficulty breathing.   Cardiovascular: Negative for chest pain, palpitations, hypertension and swelling in legs/feet.  Gastrointestinal: Negative for blood in stool, constipation and diarrhea.  Endocrine: Negative for excessive thirst and increased urination.  Genitourinary: Negative for difficulty urinating and painful urination.  Musculoskeletal: Positive for arthralgias, joint pain and morning stiffness. Negative for joint swelling, myalgias, muscle weakness, muscle tenderness  and myalgias.  Skin: Negative for color change, pallor, rash, hair loss, nodules/bumps, skin tightness, ulcers and sensitivity to sunlight.  Allergic/Immunologic: Negative for susceptible to infections.  Neurological: Negative for dizziness, numbness, headaches and weakness.  Hematological: Negative for bruising/bleeding tendency and swollen glands.  Psychiatric/Behavioral: Negative for depressed mood and sleep disturbance. The patient is not nervous/anxious.     PMFS History:  Patient Active Problem List   Diagnosis Date Noted  . Family history of autoimmune disorder 11/17/2018  . Autoimmune disease (HCC) 11/17/2018  . History of iron deficiency anemia 11/09/2018  . History of anxiety 11/09/2018    Past Medical History:  Diagnosis Date  . Autoimmune disease (HCC)     Family History  Problem Relation Age of Onset  . Fibromyalgia Mother   . Raynaud syndrome Mother   . Sjogren's syndrome Mother    Past Surgical History:  Procedure Laterality Date  . CHOLECYSTECTOMY  2016  . WISDOM TOOTH EXTRACTION  2018   Social History   Social History Narrative  . Not on file    There is no immunization history on file for this patient.   Objective: Vital Signs: BP 115/78 (BP Location: Left Arm, Patient Position: Sitting, Cuff Size: Normal)   Pulse 76   Resp 14   Ht 5\' 10"  (1.778 m)   Wt 282 lb 6.4 oz (128.1 kg)   BMI 40.52 kg/m    Physical Exam Vitals and nursing note reviewed.  Constitutional:      Appearance: She is well-developed.  HENT:     Head: Normocephalic and atraumatic.     Mouth/Throat:  Comments: No parotid tenderness or swelling.  Eyes:     Conjunctiva/sclera: Conjunctivae normal.  Cardiovascular:     Rate and Rhythm: Normal rate and regular rhythm.     Pulses: Normal pulses.     Heart sounds: Normal heart sounds.  Pulmonary:     Effort: Pulmonary effort is normal.     Breath sounds: Normal breath sounds.  Abdominal:     General: Bowel sounds are  normal.     Palpations: Abdomen is soft.  Musculoskeletal:     Cervical back: Normal range of motion.  Lymphadenopathy:     Cervical: No cervical adenopathy.  Skin:    General: Skin is warm and dry.     Capillary Refill: Capillary refill takes less than 2 seconds.     Comments: No digital ulcerations or signs of gangrene. No nailbed capillary changes noted.  No malar rash evident.   Neurological:     Mental Status: She is alert and oriented to person, place, and time.  Psychiatric:        Behavior: Behavior normal.      Musculoskeletal Exam: C-spine, thoracic spine, and lumbar spine good ROM.  Shoulder joints, elbow joints, wrist joints, MCPs, PIPs, and DIPs good ROM with no synovitis.  Complete fist formation bilaterally.  Hip joints, knee joints, ankle joints, MTPs, PIPs, and DIPs good ROM with no synovitis.  No warmth or effusion of knee joints.  No tenderness or swelling of ankle joints.    CDAI Exam: CDAI Score: -- Patient Global: --; Provider Global: -- Swollen: --; Tender: -- Joint Exam 11/04/2019   No joint exam has been documented for this visit   There is currently no information documented on the homunculus. Go to the Rheumatology activity and complete the homunculus joint exam.  Investigation: No additional findings.  Imaging: No results found.  Recent Labs: Lab Results  Component Value Date   WBC 6.4 09/22/2019   HGB 13.4 09/22/2019   PLT 267 09/22/2019   NA 140 09/22/2019   K 4.4 09/22/2019   CL 104 09/22/2019   CO2 22 09/22/2019   GLUCOSE 84 09/22/2019   BUN 11 09/22/2019   CREATININE 0.76 09/22/2019   BILITOT 0.3 09/22/2019   ALKPHOS 68 09/22/2019   AST 50 (H) 09/22/2019   ALT 25 09/22/2019   PROT 7.2 09/22/2019   ALBUMIN 4.2 09/22/2019   CALCIUM 9.3 09/22/2019   GFRAA 129 09/22/2019    Speciality Comments: PLQ Eye Exam: 01/17/19 WNL [facility]   Procedures:  No procedures performed Allergies: Patient has no known allergies.    Assessment / Plan:     Visit Diagnoses: Autoimmune disease ([place]) - ANA 1: 320 speckled, double-stranded DNA positive, RNP positive, C3-C4 normal, history of Raynauds, arthralgias, hair loss and fatigue: She has not had any signs or symptoms of autoimmune disease flare recently.  She is clinically doing well on Plaquenil 200 mg 1 tablet by mouth twice daily.  She has not had any new or worsening symptoms recently.  She has no synovitis on exam.  She continues to have pain in both hands so we will schedule an ultrasound of both hands to assess for inflammation.  She is not experiencing any other joint pain at this time.  She has not had any recent rashes but continues to have photosensitivity.  We discussed the importance of wearing sunscreen SPF greater than 50 on a daily basis and avoiding direct sun exposure.  She has not had any recent oral or  nasal ulcerations or sicca symptoms recently.  No parotid tenderness or swelling was noted.  No cervical lymphadenopathy was palpable.  She continues to have fatigue which has been stable.  She has not had any shortness of breath, chest pain, or palpitations recently.  She has not had any symptoms of Raynaud's.  No digital ulcerations or nailbed capillary changes were noted.  We will recheck autoimmune lab work today.  Due to her history of positive RNP antibody she will be scheduled for a chest x-ray. Future order placed today.  She will continue taking Plaquenil 200 mg 1 tablet by mouth twice daily.  She does not need a refill at this time.  She was advised to notify us if she develops any new or worsening symptoms.  She will follow-up in the office in 5 months.- Plan: DG Chest 2 View, CBC with Differential/Platelet, COMPLETE METABOLIC PANEL WITH GFR, Urinalysis, Routine w reflex microscopic, Anti-DNA antibody, double-stranded, C3 and C4, Sedimentation rate, RNP Antibody, ANA  High risk medication use - Plaquenil 200 mg 1 tablet by mouth twice daily. PLQ Eye Exam:  01/17/19 WNL [facility] Ophthal[facility].  CBC and CMP were drawn today to monitor for drug toxicity.- Plan: CBC with Differential/Platelet, COMPLETE METABOLIC PANEL WITH GFR  Other fatigue: She has chronic fatigue which has been stable.  Primary insomnia: She has been sleeping well at night recently.  Anti-RNP antibodies present -We will recheck RNP antibody today.  Order for future chest x-ray was placed today.  Plan: DG Chest 2 View, RNP Antibody  History of iron deficiency anemia: CBC was drawn today.  History of anxiety  Family history of autoimmune disorder   Orders: Orders Placed This Encounter  Procedures  . DG Chest 2 View  . CBC with Differential/Platelet  . COMPLETE METABOLIC PANEL WITH GFR  . Urinalysis, Routine w reflex microscopic  . Anti-DNA antibody, double-stranded  . C3 and C4  . Sedimentation rate  . RNP Antibody  . ANA   No orders of the defined types were placed in this encounter.    Follow-Up Instructions: Return in 5 months (on 04/05/2020) for Autoimmune disease, Autoimmune Disease.   Gearldine Bienenstock, PA-C  Note - This record has been created using Dragon software.  Chart creation errors have been sought, but may not always  have been located. Such creation errors do not reflect on  the standard of medical care.

## 2019-10-30 ENCOUNTER — Other Ambulatory Visit: Payer: Self-pay | Admitting: Rheumatology

## 2019-10-30 DIAGNOSIS — M359 Systemic involvement of connective tissue, unspecified: Secondary | ICD-10-CM

## 2019-10-31 NOTE — Telephone Encounter (Signed)
Last Visit: 05/20/19 Next Visit: 11/04/19 Labs: 09/22/2019 Elevated LFTs noted PLQ Eye Exam: 01/17/19 WNL  Current Dose per office note on 05/20/2019: Plaquenil 200 mg 1 tablet by mouth twice daily   Okay to refill per Dr. Corliss Skains

## 2019-11-04 ENCOUNTER — Ambulatory Visit: Payer: BC Managed Care – PPO | Admitting: Physician Assistant

## 2019-11-04 ENCOUNTER — Encounter: Payer: Self-pay | Admitting: Physician Assistant

## 2019-11-04 ENCOUNTER — Other Ambulatory Visit: Payer: Self-pay

## 2019-11-04 VITALS — BP 115/78 | HR 76 | Resp 14 | Ht 70.0 in | Wt 282.4 lb

## 2019-11-04 DIAGNOSIS — Z862 Personal history of diseases of the blood and blood-forming organs and certain disorders involving the immune mechanism: Secondary | ICD-10-CM

## 2019-11-04 DIAGNOSIS — M359 Systemic involvement of connective tissue, unspecified: Secondary | ICD-10-CM | POA: Diagnosis not present

## 2019-11-04 DIAGNOSIS — R768 Other specified abnormal immunological findings in serum: Secondary | ICD-10-CM

## 2019-11-04 DIAGNOSIS — R5383 Other fatigue: Secondary | ICD-10-CM

## 2019-11-04 DIAGNOSIS — Z8659 Personal history of other mental and behavioral disorders: Secondary | ICD-10-CM

## 2019-11-04 DIAGNOSIS — Z79899 Other long term (current) drug therapy: Secondary | ICD-10-CM

## 2019-11-04 DIAGNOSIS — F5101 Primary insomnia: Secondary | ICD-10-CM

## 2019-11-04 DIAGNOSIS — Z832 Family history of diseases of the blood and blood-forming organs and certain disorders involving the immune mechanism: Secondary | ICD-10-CM

## 2019-11-08 LAB — URINALYSIS, ROUTINE W REFLEX MICROSCOPIC
Bilirubin Urine: NEGATIVE
Glucose, UA: NEGATIVE
Hgb urine dipstick: NEGATIVE
Ketones, ur: NEGATIVE
Leukocytes,Ua: NEGATIVE
Nitrite: NEGATIVE
Protein, ur: NEGATIVE
Specific Gravity, Urine: 1.021 (ref 1.001–1.03)
pH: 6 (ref 5.0–8.0)

## 2019-11-08 LAB — ANTI-NUCLEAR AB-TITER (ANA TITER): ANA Titer 1: 1:320 {titer} — ABNORMAL HIGH

## 2019-11-08 LAB — CBC WITH DIFFERENTIAL/PLATELET
Absolute Monocytes: 348 cells/uL (ref 200–950)
Basophils Absolute: 41 cells/uL (ref 0–200)
Basophils Relative: 0.7 %
Eosinophils Absolute: 249 cells/uL (ref 15–500)
Eosinophils Relative: 4.3 %
HCT: 40 % (ref 35.0–45.0)
Hemoglobin: 13 g/dL (ref 11.7–15.5)
Lymphs Abs: 2192 cells/uL (ref 850–3900)
MCH: 29.1 pg (ref 27.0–33.0)
MCHC: 32.5 g/dL (ref 32.0–36.0)
MCV: 89.5 fL (ref 80.0–100.0)
MPV: 9.2 fL (ref 7.5–12.5)
Monocytes Relative: 6 %
Neutro Abs: 2970 cells/uL (ref 1500–7800)
Neutrophils Relative %: 51.2 %
Platelets: 253 10*3/uL (ref 140–400)
RBC: 4.47 10*6/uL (ref 3.80–5.10)
RDW: 12.3 % (ref 11.0–15.0)
Total Lymphocyte: 37.8 %
WBC: 5.8 10*3/uL (ref 3.8–10.8)

## 2019-11-08 LAB — COMPLETE METABOLIC PANEL WITH GFR
AG Ratio: 1.5 (calc) (ref 1.0–2.5)
ALT: 27 U/L (ref 6–29)
AST: 19 U/L (ref 10–30)
Albumin: 4 g/dL (ref 3.6–5.1)
Alkaline phosphatase (APISO): 55 U/L (ref 31–125)
BUN: 12 mg/dL (ref 7–25)
CO2: 27 mmol/L (ref 20–32)
Calcium: 8.9 mg/dL (ref 8.6–10.2)
Chloride: 105 mmol/L (ref 98–110)
Creat: 0.76 mg/dL (ref 0.50–1.10)
GFR, Est African American: 129 mL/min/{1.73_m2} (ref >=60)
GFR, Est Non African American: 111 mL/min/{1.73_m2} (ref >=60)
Globulin: 2.7 g/dL (calc) (ref 1.9–3.7)
Glucose, Bld: 87 mg/dL (ref 65–99)
Potassium: 3.8 mmol/L (ref 3.5–5.3)
Sodium: 139 mmol/L (ref 135–146)
Total Bilirubin: 0.3 mg/dL (ref 0.2–1.2)
Total Protein: 6.7 g/dL (ref 6.1–8.1)

## 2019-11-08 LAB — RNP ANTIBODY: Ribonucleic Protein(ENA) Antibody, IgG: 2.1 AI — AB

## 2019-11-08 LAB — ANTI-DNA ANTIBODY, DOUBLE-STRANDED: ds DNA Ab: 10 IU/mL — ABNORMAL HIGH

## 2019-11-08 LAB — ANA: Anti Nuclear Antibody (ANA): POSITIVE — AB

## 2019-11-08 LAB — C3 AND C4
C3 Complement: 172 mg/dL (ref 83–193)
C4 Complement: 27 mg/dL (ref 15–57)

## 2019-11-08 LAB — SEDIMENTATION RATE: Sed Rate: 28 mm/h — ABNORMAL HIGH (ref 0–20)

## 2019-11-08 NOTE — Progress Notes (Signed)
CBC and CMP WNL.  UA normal.  DsDNA positive but stable. Complements WNL.  ESR elevated but stable.  RNP and ANA remain positive.

## 2019-11-09 ENCOUNTER — Other Ambulatory Visit: Payer: Self-pay

## 2019-11-09 ENCOUNTER — Ambulatory Visit: Payer: Self-pay

## 2019-11-09 ENCOUNTER — Ambulatory Visit (HOSPITAL_COMMUNITY)
Admission: RE | Admit: 2019-11-09 | Discharge: 2019-11-09 | Disposition: A | Discharge status: Home / Self Care | Payer: BC Managed Care – PPO | Source: Ambulatory Visit | Attending: Physician Assistant | Admitting: Physician Assistant

## 2019-11-09 ENCOUNTER — Ambulatory Visit (INDEPENDENT_AMBULATORY_CARE_PROVIDER_SITE_OTHER): Payer: BC Managed Care – PPO | Admitting: Rheumatology

## 2019-11-09 DIAGNOSIS — R768 Other specified abnormal immunological findings in serum: Secondary | ICD-10-CM | POA: Insufficient documentation

## 2019-11-09 DIAGNOSIS — M79642 Pain in left hand: Secondary | ICD-10-CM

## 2019-11-09 DIAGNOSIS — M359 Systemic involvement of connective tissue, unspecified: Secondary | ICD-10-CM | POA: Diagnosis present

## 2019-11-09 DIAGNOSIS — M79641 Pain in right hand: Secondary | ICD-10-CM | POA: Diagnosis not present

## 2019-11-10 NOTE — Progress Notes (Signed)
CXR did not reveal any active cardiopulmonary disease.

## 2019-11-24 ENCOUNTER — Other Ambulatory Visit: Payer: Self-pay | Admitting: Rheumatology

## 2019-11-24 DIAGNOSIS — M359 Systemic involvement of connective tissue, unspecified: Secondary | ICD-10-CM

## 2019-11-24 NOTE — Telephone Encounter (Signed)
Last Visit: 11/04/2019 °Next Visit: 04/06/2020 °Labs: 11/04/2019 CBC and CMP WNL °Eye exam:  01/17/19 WNL  °  °Current Dose per office note 11/04/2019:  Plaquenil 200 mg 1 tablet by mouth twice daily °  °Okay to refill per Dr. Deveshwar  °

## 2020-01-27 ENCOUNTER — Encounter: Payer: Self-pay | Admitting: Rheumatology

## 2020-01-27 DIAGNOSIS — M359 Systemic involvement of connective tissue, unspecified: Secondary | ICD-10-CM

## 2020-01-27 MED ORDER — HYDROXYCHLOROQUINE SULFATE 200 MG PO TABS: 200.0000 mg | ORAL_TABLET | Freq: Two times a day (BID) | ORAL | 2 refills | Status: DC

## 2020-01-27 NOTE — Telephone Encounter (Signed)
Last Visit: 11/04/2019 Next Visit: 04/06/2020 Labs: 11/04/2019 CBC and CMP WNL Eye exam:  01/17/19 WNL   Current Dose per office note 11/04/2019: Plaquenil 200 mg 1 tablet by mouth twice daily  Okay to refill per Dr. Corliss Skains

## 2020-03-23 NOTE — Progress Notes (Deleted)
Office Visit Note  Patient: Tiffany Mercer             Date of Birth: Dec 30, 1995           MRN: [id number]             PCP: Cheron Schaumann., MD Referring: Cheron Schaumann.,* Visit Date: 04/06/2020 Occupation: @GUAROCC @  Subjective:  No chief complaint on file.   History of Present Illness: [name] is a 24 y.o. female ***   Activities of Daily Living:  Patient reports morning stiffness for *** {minute/hour:19697}.   Patient {ACTIONS;DENIES/REPORTS:21021675::"Denies"} nocturnal pain.  Difficulty dressing/grooming: {ACTIONS;DENIES/REPORTS:21021675::"Denies"} Difficulty climbing stairs: {ACTIONS;DENIES/REPORTS:21021675::"Denies"} Difficulty getting out of chair: {ACTIONS;DENIES/REPORTS:21021675::"Denies"} Difficulty using hands for taps, buttons, cutlery, and/or writing: {ACTIONS;DENIES/REPORTS:21021675::"Denies"}  No Rheumatology ROS completed.   PMFS History:  Patient Active Problem List   Diagnosis Date Noted  . Family history of autoimmune disorder 11/17/2018  . Autoimmune disease (HCC) 11/17/2018  . History of iron deficiency anemia 11/09/2018  . History of anxiety 11/09/2018    Past Medical History:  Diagnosis Date  . Autoimmune disease (HCC)     Family History  Problem Relation Age of Onset  . Fibromyalgia Mother   . Raynaud syndrome Mother   . Sjogren's syndrome Mother    Past Surgical History:  Procedure Laterality Date  . CHOLECYSTECTOMY  2016  . WISDOM TOOTH EXTRACTION  2018   Social History   Social History Narrative  . Not on file    There is no immunization history on file for this patient.   Objective: Vital Signs: There were no vitals taken for this visit.   Physical Exam   Musculoskeletal Exam: ***  CDAI Exam: CDAI Score: -- Patient Global: --; Provider Global: -- Swollen: --; Tender: -- Joint Exam 04/06/2020   No joint exam has been documented for this visit   There is currently no information  documented on the homunculus. Go to the Rheumatology activity and complete the homunculus joint exam.  Investigation: No additional findings.  Imaging: No results found.  Recent Labs: Lab Results  Component Value Date   WBC 5.8 11/04/2019   HGB 13.0 11/04/2019   PLT 253 11/04/2019   NA 139 11/04/2019   K 3.8 11/04/2019   CL 105 11/04/2019   CO2 27 11/04/2019   GLUCOSE 87 11/04/2019   BUN 12 11/04/2019   CREATININE 0.76 11/04/2019   BILITOT 0.3 11/04/2019   ALKPHOS 68 09/22/2019   AST 19 11/04/2019   ALT 27 11/04/2019   PROT 6.7 11/04/2019   ALBUMIN 4.2 09/22/2019   CALCIUM 8.9 11/04/2019   GFRAA 129 11/04/2019    Speciality Comments: PLQ Eye Exam: 01/17/19 WNL [facility] Ophthal[facility]   Procedures:  No procedures performed Allergies: Patient has no known allergies.   Assessment / Plan:     Visit Diagnoses: Autoimmune disease (HCC)  High risk medication use  Other fatigue  Primary insomnia  History of iron deficiency anemia  History of anxiety  Family history of autoimmune disorder  Orders: No orders of the defined types were placed in this encounter.  No orders of the defined types were placed in this encounter.   Face-to-face time spent with patient was *** minutes. Greater than 50% of time was spent in counseling and coordination of care.  Follow-Up Instructions: No follow-ups on file.   [facility], PA-C  Note - This record has been created using Dragon software.  Chart creation errors have been sought, but may not  always  have been located. Such creation errors do not reflect on  the standard of medical care.

## 2020-04-06 ENCOUNTER — Ambulatory Visit: Payer: BC Managed Care – PPO | Admitting: Physician Assistant

## 2020-04-06 DIAGNOSIS — Z8659 Personal history of other mental and behavioral disorders: Secondary | ICD-10-CM

## 2020-04-06 DIAGNOSIS — R5383 Other fatigue: Secondary | ICD-10-CM

## 2020-04-06 DIAGNOSIS — M359 Systemic involvement of connective tissue, unspecified: Secondary | ICD-10-CM

## 2020-04-06 DIAGNOSIS — Z79899 Other long term (current) drug therapy: Secondary | ICD-10-CM

## 2020-04-06 DIAGNOSIS — F5101 Primary insomnia: Secondary | ICD-10-CM

## 2020-04-06 DIAGNOSIS — Z832 Family history of diseases of the blood and blood-forming organs and certain disorders involving the immune mechanism: Secondary | ICD-10-CM

## 2020-04-06 DIAGNOSIS — Z862 Personal history of diseases of the blood and blood-forming organs and certain disorders involving the immune mechanism: Secondary | ICD-10-CM

## 2020-08-01 ENCOUNTER — Other Ambulatory Visit: Payer: Self-pay | Admitting: Rheumatology

## 2020-08-01 DIAGNOSIS — M359 Systemic involvement of connective tissue, unspecified: Secondary | ICD-10-CM

## 2020-08-02 ENCOUNTER — Telehealth: Payer: Self-pay

## 2020-08-02 DIAGNOSIS — M359 Systemic involvement of connective tissue, unspecified: Secondary | ICD-10-CM

## 2020-08-02 DIAGNOSIS — Z79899 Other long term (current) drug therapy: Secondary | ICD-10-CM

## 2020-08-02 NOTE — Telephone Encounter (Signed)
Left message to advise patient we are unable to refill her PLQ until after she has update her labs. Advised the last labs we have on file are from 10/2019 and they are required to be update every 5 months. Standing orders placed.

## 2020-08-02 NOTE — Telephone Encounter (Signed)
Patient called requesting prescription refill of Hydroxychloroquine to be sent to [facility] at [address] in [place].  Patient states she is scheduled for her Plaquenil eye exam on 10/03/20.

## 2020-08-20 NOTE — Progress Notes (Signed)
Office Visit Note  Patient: Tiffany Mercer             Date of Birth: Sep 01, 1995           MRN: [id number]             PCP: Loraine Leriche., MD Referring: Loraine Leriche.,* Visit Date: 09/03/2020 Occupation: @GUAROCC @  Subjective:  Medication monitoring   History of Present Illness: Tiffany Mercer is a 25 y.o. female with history of autoimmune disease.  She is taking plaquenil 200 mg 1 tablet by mouth twice daily.  Morning Plaquenil without any side effects.  She denies any signs or symptoms of a flare.  She states that she experiences intermittent discomfort and stiffness in her lower back and both feet.  She denies any joint swelling.  She states that she recently got a new job and has been standing for prolonged periods of time which has contributed to some of her increased arthralgias.  She denies any nocturnal pain.  She has been sleeping well at night.  Her level of fatigue has been stable.  She denies any recent rashes but has ongoing photosensitivity.  She denies any hair loss.  She has not noticed any oral or nasal ulcerations.  She has ongoing eye dryness and uses over-the-counter eyedrops for symptomatic relief.  She denies any shortness of breath, pleuritic chest pain, or palpitations.  She has had intermittent symptoms of Raynaud's typically on a daily basis with the cooler weather temperatures.  She denies any digital ulcerations or signs of gangrene.  She has been wearing gloves and socks which of alleviated her symptoms.  She denies any recent infections.  She has not had any swollen lymph nodes.  She denies any appetite changes.  She denies any other new questions or concerns. She has a Plaquenil eye exam scheduled in April 2022.  Activities of Daily Living:  Patient reports morning stiffness for 10-15 minutes.   Patient Denies nocturnal pain.  Difficulty dressing/grooming: Denies Difficulty climbing stairs: Denies Difficulty getting out of chair:  Denies Difficulty using hands for taps, buttons, cutlery, and/or writing: Denies  Review of Systems  Constitutional: Positive for fatigue.  HENT: Negative for mouth sores, mouth dryness and nose dryness.   Eyes: Positive for dryness. Negative for pain and visual disturbance.  Respiratory: Negative for cough, hemoptysis, shortness of breath and difficulty breathing.   Cardiovascular: Negative for chest pain, palpitations, hypertension and swelling in legs/feet.  Gastrointestinal: Negative for blood in stool, constipation and diarrhea.  Endocrine: Negative for increased urination.  Genitourinary: Negative for painful urination.  Musculoskeletal: Positive for arthralgias and joint pain. Negative for joint swelling, myalgias, muscle weakness, morning stiffness, muscle tenderness and myalgias.  Skin: Positive for color change. Negative for pallor, rash, hair loss, nodules/bumps, skin tightness, ulcers and sensitivity to sunlight.  Allergic/Immunologic: Negative for susceptible to infections.  Neurological: Negative for dizziness, numbness, headaches and weakness.  Hematological: Negative for swollen glands.  Psychiatric/Behavioral: Negative for depressed mood and sleep disturbance. The patient is nervous/anxious.     PMFS History:  Patient Active Problem List   Diagnosis Date Noted  . Family history of autoimmune disorder 11/17/2018  . Autoimmune disease ([place]) 11/17/2018  . History of iron deficiency anemia 11/09/2018  . History of anxiety 11/09/2018    Past Medical History:  Diagnosis Date  . Autoimmune disease ([place])     Family History  Problem Relation Age of Onset  . Fibromyalgia Mother   . Raynaud syndrome  Mother   . Sjogren's syndrome Mother    Past Surgical History:  Procedure Laterality Date  . CHOLECYSTECTOMY  2016  . WISDOM TOOTH EXTRACTION  2018   Social History   Social History Narrative  . Not on file   Immunization History  Administered Date(s) Administered   . Moderna Sars-Covid-2 Vaccination 10/02/2019, 10/30/2019, 05/28/2020     Objective: Vital Signs: BP 120/82 (BP Location: Right Arm, Patient Position: Sitting, Cuff Size: Large)   Pulse 74   Resp 15   Ht $R'5\' 10"'oC$  (1.778 m)   Wt 291 lb (132 kg)   BMI 41.75 kg/m    Physical Exam Vitals and nursing note reviewed.  Constitutional:      Appearance: She is well-developed.  HENT:     Head: Normocephalic and atraumatic.  Eyes:     Conjunctiva/sclera: Conjunctivae normal.  Pulmonary:     Effort: Pulmonary effort is normal.  Abdominal:     Palpations: Abdomen is soft.  Musculoskeletal:     Cervical back: Normal range of motion.  Skin:    General: Skin is warm and dry.     Capillary Refill: Capillary refill takes less than 2 seconds.     Comments: No sclerodactyly or digital ulcerations noted.  No malar rash  Neurological:     Mental Status: She is alert and oriented to person, place, and time.  Psychiatric:        Behavior: Behavior normal.      Musculoskeletal Exam: C-spine, thoracic spine, lumbar spine have good range of motion with no discomfort.  Bandlike lower back pain over paraspinal muscles and lumbar region.  No SI joint tenderness.  Shoulder joints, elbow joints, wrist joints, MCPs, PIPs, DIPs good range of motion with no synovitis.  Tenderness along the right elbow joint line noted.  She has complete fist formation bilaterally.  Hip joints have good range of motion with no discomfort.  Tenderness of patient over the left trochanteric bursa.  Knee joints have good range of motion with no warmth or effusion.  No calf tightness or tenderness noted.  Ankle joints have good range of motion with no tenderness or swelling.  No tenderness of MTP joints.  CDAI Exam: CDAI Score: - Patient Global: -; Provider Global: - Swollen: -; Tender: - Joint Exam 09/03/2020   No joint exam has been documented for this visit   There is currently no information documented on the homunculus.  Go to the Rheumatology activity and complete the homunculus joint exam.  Investigation: No additional findings.  Imaging: No results found.  Recent Labs: Lab Results  Component Value Date   WBC 5.8 11/04/2019   HGB 13.0 11/04/2019   PLT 253 11/04/2019   NA 139 11/04/2019   K 3.8 11/04/2019   CL 105 11/04/2019   CO2 27 11/04/2019   GLUCOSE 87 11/04/2019   BUN 12 11/04/2019   CREATININE 0.76 11/04/2019   BILITOT 0.3 11/04/2019   ALKPHOS 68 09/22/2019   AST 19 11/04/2019   ALT 27 11/04/2019   PROT 6.7 11/04/2019   ALBUMIN 4.2 09/22/2019   CALCIUM 8.9 11/04/2019   GFRAA 129 11/04/2019    Speciality Comments: PLQ Eye Exam: 01/17/19 WNL [facility] Ophthalmology   Procedures:  No procedures performed Allergies: Patient has no known allergies.   Assessment / Plan:     Visit Diagnoses: Autoimmune disease ([place]) - ANA 1: 320 speckled, double-stranded DNA positive, RNP positive, C3-C4 normal, history of Raynauds, arthralgias, hair loss and fatigue: She has not  had any signs or symptoms of a systemic autoimmune disease flare recently.  She is clinically doing well taking Plaquenil 200 mg 1 tablet by mouth twice daily.  She is tolerating Plaquenil without any side effects.  She has no synovitis on exam.  She continues to have intermittent arthralgias and joint stiffness especially in her lower back and both feet.  She has not had any recent rashes or hair loss.  She has ongoing photosensitivity and we discussed the importance of avoiding direct sun exposure and wearing sunscreen SPF greater than 50 on a daily basis.  She continues to experience Raynaud's phenomenon which has been exacerbated by cooler weather temperatures.  No sclerodactyly or digital ulcerations were noted.  We discussed the importance of keeping her core body temperature warm and wearing gloves and socks on a daily basis.  She has not had any oral or nasal ulcerations.  She has ongoing eye dryness and uses over-the-counter eyedrops  for symptomatic relief.  She has not had any recent infections, low-grade fevers, increased fatigue, or swollen lymph nodes.  No shortness of breath, pleuritic chest pain, or palpitations recently.  Lab work from 11/04/2019 was reviewed today in the office: ANA 1: 320 nuclear, speckled, double-stranded DNA 10, ESR 28, RNP 2.1, and complements within normal limits.  We will recheck the following lab work today.  She was advised to notify us if she develops any new or worsening symptoms.  She will continue taking Plaquenil as prescribed.  She will follow-up in the office in 5 months.- Plan: CBC with Differential/Platelet, COMPLETE METABOLIC PANEL WITH GFR, Urinalysis, Routine w reflex microscopic, ANA, Anti-DNA antibody, double-stranded, C3 and C4, Sedimentation rate, RNP Antibody, hydroxychloroquine (PLAQUENIL) 200 MG tablet  High risk medication use - Plaquenil 200 mg 1 tablet by mouth twice daily. PLQ Eye Exam: 01/17/19 WNL [facility] Ophthalmology.  She has a Plaquenil eye exam scheduled in April 2022.  She was given a Plaquenil eye exam form to take with her to her upcoming appointment.  CBC and CMP within normal limits on 11/04/2019.  She is overdue to update lab work.  Orders for CBC and CMP were released.- Plan: CBC with Differential/Platelet, COMPLETE METABOLIC PANEL WITH GFR She has not had any recent infections. She has received 3 moderna covid-19 vaccine doses.  Primary insomnia: She takes melatonin 10 mg by mouth at bedtime for insomnia.  She has not been experiencing any nocturnal pain.  Other fatigue: Stable.  Anti-RNP antibodies present - RNP antibodies will be rechecked today.  Plan: RNP Antibody  History of iron deficiency anemia: Hemoglobin was within normal limits on 11/04/2019.  We will recheck CBC with differential today.  History of anxiety: She remains on Lexapro as prescribed.  Family history of autoimmune disorder   Orders: Orders Placed This Encounter  Procedures  . CBC with  Differential/Platelet  . COMPLETE METABOLIC PANEL WITH GFR  . Urinalysis, Routine w reflex microscopic  . ANA  . Anti-DNA antibody, double-stranded  . C3 and C4  . Sedimentation rate  . RNP Antibody   Meds ordered this encounter  Medications  . hydroxychloroquine (PLAQUENIL) 200 MG tablet    Sig: Take 1 tablet (200 mg total) by mouth 2 (two) times daily.    Dispense:  60 tablet    Refill:  2     Follow-Up Instructions: Return in about 5 months (around 02/03/2021) for Autoimmune Disease.   Ofilia Neas, PA-C  Note - This record has been created using Dragon software.  Chart creation errors have been sought, but may not always  have been located. Such creation errors do not reflect on  the standard of medical care.

## 2020-09-03 ENCOUNTER — Encounter: Payer: Self-pay | Admitting: Physician Assistant

## 2020-09-03 ENCOUNTER — Ambulatory Visit: Payer: Managed Care, Other (non HMO) | Admitting: Physician Assistant

## 2020-09-03 ENCOUNTER — Other Ambulatory Visit: Payer: Self-pay

## 2020-09-03 VITALS — BP 120/82 | HR 74 | Resp 15 | Ht 70.0 in | Wt 291.0 lb

## 2020-09-03 DIAGNOSIS — R768 Other specified abnormal immunological findings in serum: Secondary | ICD-10-CM

## 2020-09-03 DIAGNOSIS — M359 Systemic involvement of connective tissue, unspecified: Secondary | ICD-10-CM | POA: Diagnosis not present

## 2020-09-03 DIAGNOSIS — Z862 Personal history of diseases of the blood and blood-forming organs and certain disorders involving the immune mechanism: Secondary | ICD-10-CM

## 2020-09-03 DIAGNOSIS — R5383 Other fatigue: Secondary | ICD-10-CM

## 2020-09-03 DIAGNOSIS — Z8659 Personal history of other mental and behavioral disorders: Secondary | ICD-10-CM

## 2020-09-03 DIAGNOSIS — Z832 Family history of diseases of the blood and blood-forming organs and certain disorders involving the immune mechanism: Secondary | ICD-10-CM

## 2020-09-03 DIAGNOSIS — Z79899 Other long term (current) drug therapy: Secondary | ICD-10-CM | POA: Diagnosis not present

## 2020-09-03 DIAGNOSIS — F5101 Primary insomnia: Secondary | ICD-10-CM

## 2020-09-03 DIAGNOSIS — R7689 Other specified abnormal immunological findings in serum: Secondary | ICD-10-CM

## 2020-09-03 MED ORDER — HYDROXYCHLOROQUINE SULFATE 200 MG PO TABS: 200.0000 mg | ORAL_TABLET | Freq: Two times a day (BID) | ORAL | 2 refills | Status: DC

## 2020-09-03 NOTE — Patient Instructions (Signed)
Hip Bursitis Rehab Ask your health care provider which exercises are safe for you. Do exercises exactly as told by your health care provider and adjust them as directed. It is normal to feel mild stretching, pulling, tightness, or discomfort as you do these exercises. Stop right away if you feel sudden pain or your pain gets worse. Do not begin these exercises until told by your health care provider. Stretching exercise This exercise warms up your muscles and joints and improves the movement and flexibility of your hip. This exercise also helps to relieve pain and stiffness. Iliotibial band stretch An iliotibial band is a strong band of muscle tissue that runs from the outer side of your hip to the outer side of your thigh and knee. 1. Lie on your side with your left / right leg in the top position. 2. Bend your left / right knee and grab your ankle. Stretch out your bottom arm to help you balance. 3. Slowly bring your knee back so your thigh is behind your body. 4. Slowly lower your knee toward the floor until you feel a gentle stretch on the outside of your left / right thigh. If you do not feel a stretch and your knee will not fall farther, place the heel of your other foot on top of your knee and pull your knee down toward the floor with your foot. 5. Hold this position for __________ seconds. 6. Slowly return to the starting position. Repeat __________ times. Complete this exercise __________ times a day.   Strengthening exercises These exercises build strength and endurance in your hip and pelvis. Endurance is the ability to use your muscles for a long time, even after they get tired. Bridge This exercise strengthens the muscles that move your thigh backward (hip extensors). 1. Lie on your back on a firm surface with your knees bent and your feet flat on the floor. 2. Tighten your buttocks muscles and lift your buttocks off the floor until your trunk is level with your thighs. ? Do not arch  your back. ? You should feel the muscles working in your buttocks and the back of your thighs. If you do not feel these muscles, slide your feet 1-2 inches (2.5-5 cm) farther away from your buttocks. ? If this exercise is too easy, try doing it with your arms crossed over your chest. 3. Hold this position for __________ seconds. 4. Slowly lower your hips to the starting position. 5. Let your muscles relax completely after each repetition. Repeat __________ times. Complete this exercise __________ times a day.   Squats This exercise strengthens the muscles in front of your thigh and knee (quadriceps). 1. Stand in front of a table, with your feet and knees pointing straight ahead. You may rest your hands on the table for balance but not for support. 2. Slowly bend your knees and lower your hips like you are going to sit in a chair. ? Keep your weight over your heels, not over your toes. ? Keep your lower legs upright so they are parallel with the table legs. ? Do not let your hips go lower than your knees. ? Do not bend lower than told by your health care provider. ? If your hip pain increases, do not bend as low. 3. Hold the squat position for __________ seconds. 4. Slowly push with your legs to return to standing. Do not use your hands to pull yourself to standing. Repeat __________ times. Complete this exercise __________ times a day.   Hip hike 1. Stand sideways on a bottom step. Stand on your left / right leg with your other foot unsupported next to the step. You can hold on to the railing or wall for balance if needed. 2. Keep your knees straight and your torso square. Then lift your left / right hip up toward the ceiling. 3. Hold this position for __________ seconds. 4. Slowly let your left / right hip lower toward the floor, past the starting position. Your foot should get closer to the floor. Do not lean or bend your knees. Repeat __________ times. Complete this exercise __________ times  a day. Single leg stand 1. Without shoes, stand near a railing or in a doorway. You may hold on to the railing or door frame as needed for balance. 2. Squeeze your left / right buttock muscles, then lift up your other foot. ? Do not let your left / right hip push out to the side. ? It is helpful to stand in front of a mirror for this exercise so you can watch your hip. 3. Hold this position for __________ seconds. Repeat __________ times. Complete this exercise __________ times a day. This information is not intended to replace advice given to you by your health care provider. Make sure you discuss any questions you have with your health care provider. Document Revised: 09/27/2018 Document Reviewed: 09/27/2018 Elsevier Patient Education  2021 [employer] Back Exercises These exercises help to make your trunk and back strong. They also help to keep the lower back flexible. Doing these exercises can help to prevent back pain or lessen existing pain.  If you have back pain, try to do these exercises 2-3 times each day or as told by your doctor.  As you get better, do the exercises once each day. Repeat the exercises more often as told by your doctor.  To stop back pain from coming back, do the exercises once each day, or as told by your doctor. Exercises Single knee to chest Do these steps 3-5 times in a row for each leg: 1. Lie on your back on a firm bed or the floor with your legs stretched out. 2. Bring one knee to your chest. 3. Grab your knee or thigh with both hands and hold them it in place. 4. Pull on your knee until you feel a gentle stretch in your lower back or buttocks. 5. Keep doing the stretch for 10-30 seconds. 6. Slowly let go of your leg and straighten it. Pelvic tilt Do these steps 5-10 times in a row: 1. Lie on your back on a firm bed or the floor with your legs stretched out. 2. Bend your knees so they point up to the ceiling. Your feet should be flat on the  floor. 3. Tighten your lower belly (abdomen) muscles to press your lower back against the floor. This will make your tailbone point up to the ceiling instead of pointing down to your feet or the floor. 4. Stay in this position for 5-10 seconds while you gently tighten your muscles and breathe evenly. Cat-cow Do these steps until your lower back bends more easily: 1. Get on your hands and knees on a firm surface. Keep your hands under your shoulders, and keep your knees under your hips. You may put padding under your knees. 2. Let your head hang down toward your chest. Tighten (contract) the muscles in your belly. Point your tailbone toward the floor so your lower back becomes rounded like the back  of a cat. 3. Stay in this position for 5 seconds. 4. Slowly lift your head. Let the muscles of your belly relax. Point your tailbone up toward the ceiling so your back forms a sagging arch like the back of a cow. 5. Stay in this position for 5 seconds.   Press-ups Do these steps 5-10 times in a row: 1. Lie on your belly (face-down) on the floor. 2. Place your hands near your head, about shoulder-width apart. 3. While you keep your back relaxed and keep your hips on the floor, slowly straighten your arms to raise the top half of your body and lift your shoulders. Do not use your back muscles. You may change where you place your hands in order to make yourself more comfortable. 4. Stay in this position for 5 seconds. 5. Slowly return to lying flat on the floor.   Bridges Do these steps 10 times in a row: 1. Lie on your back on a firm surface. 2. Bend your knees so they point up to the ceiling. Your feet should be flat on the floor. Your arms should be flat at your sides, next to your body. 3. Tighten your butt muscles and lift your butt off the floor until your waist is almost as high as your knees. If you do not feel the muscles working in your butt and the back of your thighs, slide your feet 1-2  inches farther away from your butt. 4. Stay in this position for 3-5 seconds. 5. Slowly lower your butt to the floor, and let your butt muscles relax. If this exercise is too easy, try doing it with your arms crossed over your chest.   Belly crunches Do these steps 5-10 times in a row: 1. Lie on your back on a firm bed or the floor with your legs stretched out. 2. Bend your knees so they point up to the ceiling. Your feet should be flat on the floor. 3. Cross your arms over your chest. 4. Tip your chin a little bit toward your chest but do not bend your neck. 5. Tighten your belly muscles and slowly raise your chest just enough to lift your shoulder blades a tiny bit off of the floor. Avoid raising your body higher than that, because it can put too much stress on your low back. 6. Slowly lower your chest and your head to the floor. Back lifts Do these steps 5-10 times in a row: 1. Lie on your belly (face-down) with your arms at your sides, and rest your forehead on the floor. 2. Tighten the muscles in your legs and your butt. 3. Slowly lift your chest off of the floor while you keep your hips on the floor. Keep the back of your head in line with the curve in your back. Look at the floor while you do this. 4. Stay in this position for 3-5 seconds. 5. Slowly lower your chest and your face to the floor. Contact a doctor if:  Your back pain gets a lot worse when you do an exercise.  Your back pain does not get better 2 hours after you exercise. If you have any of these problems, stop doing the exercises. Do not do them again unless your doctor says it is okay. Get help right away if:  You have sudden, very bad back pain. If this happens, stop doing the exercises. Do not do them again unless your doctor says it is okay. This information is not intended to  replace advice given to you by your health care provider. Make sure you discuss any questions you have with your health care  provider. Document Revised: 02/25/2018 Document Reviewed: 02/25/2018 Elsevier Patient Education  2021 [employer].

## 2020-09-04 NOTE — Progress Notes (Signed)
CBC WNL.  Calcium is borderline low-please increase dietary intake of calcium.  Rest of CMP WNL.  ESR is borderline elevated but stable-22.   UA revealed trace protein.  Please add protein creatinine ratio.

## 2020-09-05 NOTE — Addendum Note (Signed)
Addended by: Audrie Lia on: 09/05/2020 11:37 AM   Modules accepted: Orders

## 2020-09-05 NOTE — Progress Notes (Signed)
dsDNA, C3 and C4 and Sed rate pended for 3 months.

## 2020-09-05 NOTE — Progress Notes (Signed)
RNP remains positive but stable titer.  dsDNA is positive-12.  Complements WNL.  Protein creatinine ratio is WNL.   Recommend repeating autoimmune lab work in 3 months. Continue current dose of PLQ.  ANA is pending.

## 2020-09-06 LAB — COMPLETE METABOLIC PANEL WITH GFR
AG Ratio: 1.2 (calc) (ref 1.0–2.5)
ALT: 15 U/L (ref 6–29)
AST: 16 U/L (ref 10–30)
Albumin: 3.7 g/dL (ref 3.6–5.1)
Alkaline phosphatase (APISO): 62 U/L (ref 31–125)
BUN: 11 mg/dL (ref 7–25)
CO2: 24 mmol/L (ref 20–32)
Calcium: 8.5 mg/dL — ABNORMAL LOW (ref 8.6–10.2)
Chloride: 107 mmol/L (ref 98–110)
Creat: 0.73 mg/dL (ref 0.50–1.10)
GFR, Est African American: 135 mL/min/{1.73_m2} (ref >=60)
GFR, Est Non African American: 117 mL/min/{1.73_m2} (ref >=60)
Globulin: 3 g/dL (calc) (ref 1.9–3.7)
Glucose, Bld: 94 mg/dL (ref 65–99)
Potassium: 4.1 mmol/L (ref 3.5–5.3)
Sodium: 140 mmol/L (ref 135–146)
Total Bilirubin: 0.3 mg/dL (ref 0.2–1.2)
Total Protein: 6.7 g/dL (ref 6.1–8.1)

## 2020-09-06 LAB — TEST AUTHORIZATION

## 2020-09-06 LAB — URINALYSIS, ROUTINE W REFLEX MICROSCOPIC
Bilirubin Urine: NEGATIVE
Glucose, UA: NEGATIVE
Hgb urine dipstick: NEGATIVE
Hyaline Cast: NONE SEEN /LPF
Ketones, ur: NEGATIVE
Leukocytes,Ua: NEGATIVE
Nitrite: NEGATIVE
RBC / HPF: NONE SEEN /HPF (ref 0–2)
Specific Gravity, Urine: 1.027 (ref 1.001–1.03)
pH: 6.5 (ref 5.0–8.0)

## 2020-09-06 LAB — PROTEIN / CREATININE RATIO, URINE
Creatinine, Urine: 190 mg/dL (ref 20–275)
Protein/Creat Ratio: 116 mg/g creat (ref 21–161)
Protein/Creatinine Ratio: 0.116 mg/mg creat (ref 0.021–0.16)
Total Protein, Urine: 22 mg/dL (ref 5–24)

## 2020-09-06 LAB — CBC WITH DIFFERENTIAL/PLATELET
Absolute Monocytes: 402 cells/uL (ref 200–950)
Basophils Absolute: 42 cells/uL (ref 0–200)
Basophils Relative: 0.7 %
Eosinophils Absolute: 330 cells/uL (ref 15–500)
Eosinophils Relative: 5.5 %
HCT: 38.5 % (ref 35.0–45.0)
Hemoglobin: 12.9 g/dL (ref 11.7–15.5)
Lymphs Abs: 1698 cells/uL (ref 850–3900)
MCH: 28.5 pg (ref 27.0–33.0)
MCHC: 33.5 g/dL (ref 32.0–36.0)
MCV: 85 fL (ref 80.0–100.0)
MPV: 10 fL (ref 7.5–12.5)
Monocytes Relative: 6.7 %
Neutro Abs: 3528 cells/uL (ref 1500–7800)
Neutrophils Relative %: 58.8 %
Platelets: 260 10*3/uL (ref 140–400)
RBC: 4.53 10*6/uL (ref 3.80–5.10)
RDW: 12.4 % (ref 11.0–15.0)
Total Lymphocyte: 28.3 %
WBC: 6 10*3/uL (ref 3.8–10.8)

## 2020-09-06 LAB — ANTI-NUCLEAR AB-TITER (ANA TITER): ANA Titer 1: 1:80 {titer} — ABNORMAL HIGH

## 2020-09-06 LAB — C3 AND C4
C3 Complement: 156 mg/dL (ref 83–193)
C4 Complement: 25 mg/dL (ref 15–57)

## 2020-09-06 LAB — SEDIMENTATION RATE: Sed Rate: 22 mm/h — ABNORMAL HIGH (ref 0–20)

## 2020-09-06 LAB — ANTI-DNA ANTIBODY, DOUBLE-STRANDED: ds DNA Ab: 12 IU/mL — ABNORMAL HIGH

## 2020-09-06 LAB — RNP ANTIBODY: Ribonucleic Protein(ENA) Antibody, IgG: 2 AI — AB

## 2020-09-06 LAB — ANA: Anti Nuclear Antibody (ANA): POSITIVE — AB

## 2020-09-06 NOTE — Progress Notes (Signed)
ANA remains positive but is a low, nonspecific titer.

## 2020-09-10 ENCOUNTER — Encounter: Payer: Self-pay | Admitting: *Deleted

## 2020-11-28 ENCOUNTER — Other Ambulatory Visit: Payer: Self-pay | Admitting: Physician Assistant

## 2020-11-28 DIAGNOSIS — M359 Systemic involvement of connective tissue, unspecified: Secondary | ICD-10-CM

## 2020-11-28 NOTE — Telephone Encounter (Signed)
Last Visit: 09/03/2020  Next Visit: 02/11/2021  Labs: 09/03/2020 CBC WNL.  Calcium is borderline low-please increase dietary intake of calcium. Rest of CMP WNL.   Eye exam: 10/10/2020 WNL   Current Dose per office note 09/03/2020: Plaquenil 200 mg 1 tablet by mouth twice daily.  DX: Autoimmune disease  Last Fill: 09/03/2020  Okay to refill Plaquenil?

## 2021-01-28 NOTE — Progress Notes (Deleted)
Office Visit Note  Patient: Tiffany Mercer             Date of Birth: 04-30-1996           MRN: [id number]             PCP: Cheron Schaumann., MD Referring: Cheron Schaumann.,* Visit Date: 02/11/2021 Occupation: @GUAROCC @  Subjective:  No chief complaint on file.   History of Present Illness: Tiffany Mercer is a 25 y.o. female ***   Activities of Daily Living:  Patient reports morning stiffness for *** {minute/hour:19697}.   Patient {ACTIONS;DENIES/REPORTS:21021675::"Denies"} nocturnal pain.  Difficulty dressing/grooming: {ACTIONS;DENIES/REPORTS:21021675::"Denies"} Difficulty climbing stairs: {ACTIONS;DENIES/REPORTS:21021675::"Denies"} Difficulty getting out of chair: {ACTIONS;DENIES/REPORTS:21021675::"Denies"} Difficulty using hands for taps, buttons, cutlery, and/or writing: {ACTIONS;DENIES/REPORTS:21021675::"Denies"}  No Rheumatology ROS completed.   PMFS History:  Patient Active Problem List   Diagnosis Date Noted   Family history of autoimmune disorder 11/17/2018   Autoimmune disease (HCC) 11/17/2018   History of iron deficiency anemia 11/09/2018   History of anxiety 11/09/2018    Past Medical History:  Diagnosis Date   Autoimmune disease (HCC)     Family History  Problem Relation Age of Onset   Fibromyalgia Mother    Raynaud syndrome Mother    Sjogren's syndrome Mother    Past Surgical History:  Procedure Laterality Date   CHOLECYSTECTOMY  2016   WISDOM TOOTH EXTRACTION  2018   Social History   Social History Narrative   Not on file   Immunization History  Administered Date(s) Administered   Moderna Sars-Covid-2 Vaccination 10/02/2019, 10/30/2019, 05/28/2020     Objective: Vital Signs: There were no vitals taken for this visit.   Physical Exam   Musculoskeletal Exam: ***  CDAI Exam: CDAI Score: -- Patient Global: --; Provider Global: -- Swollen: --; Tender: -- Joint Exam 02/11/2021   No joint exam has been  documented for this visit   There is currently no information documented on the homunculus. Go to the Rheumatology activity and complete the homunculus joint exam.  Investigation: No additional findings.  Imaging: No results found.  Recent Labs: Lab Results  Component Value Date   WBC 6.0 09/03/2020   HGB 12.9 09/03/2020   PLT 260 09/03/2020   NA 140 09/03/2020   K 4.1 09/03/2020   CL 107 09/03/2020   CO2 24 09/03/2020   GLUCOSE 94 09/03/2020   BUN 11 09/03/2020   CREATININE 0.73 09/03/2020   BILITOT 0.3 09/03/2020   ALKPHOS 68 09/22/2019   AST 16 09/03/2020   ALT 15 09/03/2020   PROT 6.7 09/03/2020   ALBUMIN 4.2 09/22/2019   CALCIUM 8.5 (L) 09/03/2020   GFRAA 135 09/03/2020    Speciality Comments: PLQ Eye Exam:10/10/2020 WNL [facility] Ophthalmology Follow up 1 year  Procedures:  No procedures performed Allergies: Patient has no known allergies.   Assessment / Plan:     Visit Diagnoses: No diagnosis found.  Orders: No orders of the defined types were placed in this encounter.  No orders of the defined types were placed in this encounter.   Face-to-face time spent with patient was *** minutes. Greater than 50% of time was spent in counseling and coordination of care.  Follow-Up Instructions: No follow-ups on file.   [facility], CMA  Note - This record has been created using Ellen Henri.  Chart creation errors have been sought, but may not always  have been located. Such creation errors do not reflect on  the standard of medical care.

## 2021-02-08 ENCOUNTER — Telehealth: Payer: Self-pay

## 2021-02-08 DIAGNOSIS — M359 Systemic involvement of connective tissue, unspecified: Secondary | ICD-10-CM

## 2021-02-08 DIAGNOSIS — Z79899 Other long term (current) drug therapy: Secondary | ICD-10-CM

## 2021-02-08 NOTE — Telephone Encounter (Signed)
Patient called to cancel her appointment on Monday, 02/11/21.  Patient is not sure if she is due for labwork and if this appointment can be scheduled for virtual or if she needs in person.

## 2021-02-11 ENCOUNTER — Ambulatory Visit: Payer: Managed Care, Other (non HMO) | Admitting: Rheumatology

## 2021-02-11 DIAGNOSIS — Z8659 Personal history of other mental and behavioral disorders: Secondary | ICD-10-CM

## 2021-02-11 DIAGNOSIS — Z832 Family history of diseases of the blood and blood-forming organs and certain disorders involving the immune mechanism: Secondary | ICD-10-CM

## 2021-02-11 DIAGNOSIS — Z79899 Other long term (current) drug therapy: Secondary | ICD-10-CM

## 2021-02-11 DIAGNOSIS — Z862 Personal history of diseases of the blood and blood-forming organs and certain disorders involving the immune mechanism: Secondary | ICD-10-CM

## 2021-02-11 DIAGNOSIS — R768 Other specified abnormal immunological findings in serum: Secondary | ICD-10-CM

## 2021-02-11 DIAGNOSIS — F5101 Primary insomnia: Secondary | ICD-10-CM

## 2021-02-11 DIAGNOSIS — R5383 Other fatigue: Secondary | ICD-10-CM

## 2021-02-11 DIAGNOSIS — M359 Systemic involvement of connective tissue, unspecified: Secondary | ICD-10-CM

## 2021-02-11 NOTE — Telephone Encounter (Signed)
Attempted to contact the patient and left message to advise patient she is due for labs now. Advised patient it is preferred that she be seen in the office if at all possible. Future orders placed for labs.

## 2021-02-19 ENCOUNTER — Other Ambulatory Visit: Payer: Self-pay | Admitting: Physician Assistant

## 2021-02-19 DIAGNOSIS — M359 Systemic involvement of connective tissue, unspecified: Secondary | ICD-10-CM

## 2021-02-19 NOTE — Telephone Encounter (Signed)
Last Visit: 09/03/2020   Next Visit: due August 2022.   Labs: 09/03/2020 CBC WNL.  Calcium is borderline low-please increase dietary intake of calcium. Rest of CMP WNL.    Eye exam: 10/10/2020 WNL    Current Dose per office note 09/03/2020: Plaquenil 200 mg 1 tablet by mouth twice daily.   DX: Autoimmune disease   Last Fill: 11/28/2020  Attempted to contact the patient and left message to advise patient she is due to update labs and schedule a follow up appointment.    Okay to refill Plaquenil?

## 2021-03-22 ENCOUNTER — Other Ambulatory Visit: Payer: Self-pay | Admitting: Physician Assistant

## 2021-03-22 DIAGNOSIS — M359 Systemic involvement of connective tissue, unspecified: Secondary | ICD-10-CM

## 2021-03-22 NOTE — Telephone Encounter (Signed)
Next Visit: Due August 2022.   Last Visit: 09/03/2020  Labs: 09/03/2020 CBC WNL.  Calcium is borderline low-please increase dietary intake of calcium.  Rest of CMP WNL.  Eye exam: 10/10/2020 WNL   Current Dose per office note 09/03/2020: Plaquenil 200 mg 1 tablet by mouth twice daily.  DG:[id number] disease   Last Fill: 02/19/2021 (30 day supply)   Attempted to contact the patient and left message to advise patient she is due to update labs and schedule a follow up appointment.   Okay to refill Plaquenil?

## 2021-05-14 ENCOUNTER — Other Ambulatory Visit: Payer: Self-pay | Admitting: Physician Assistant

## 2021-05-14 DIAGNOSIS — M359 Systemic involvement of connective tissue, unspecified: Secondary | ICD-10-CM

## 2021-11-04 NOTE — Progress Notes (Deleted)
Office Visit Note  Patient: Tiffany Mercer             Date of Birth: Sep 11, 1995           MRN: [id number]             PCP: Cheron Schaumann., MD Referring: Cheron Schaumann.,* Visit Date: 11/14/2021 Occupation: @GUAROCC @  Subjective:  No chief complaint on file.   History of Present Illness: Tiffany Mercer is a 26 y.o. female ***   Activities of Daily Living:  Patient reports morning stiffness for *** {minute/hour:19697}.   Patient {ACTIONS;DENIES/REPORTS:21021675::"Denies"} nocturnal pain.  Difficulty dressing/grooming: {ACTIONS;DENIES/REPORTS:21021675::"Denies"} Difficulty climbing stairs: {ACTIONS;DENIES/REPORTS:21021675::"Denies"} Difficulty getting out of chair: {ACTIONS;DENIES/REPORTS:21021675::"Denies"} Difficulty using hands for taps, buttons, cutlery, and/or writing: {ACTIONS;DENIES/REPORTS:21021675::"Denies"}  No Rheumatology ROS completed.   PMFS History:  Patient Active Problem List   Diagnosis Date Noted   Family history of autoimmune disorder 11/17/2018   Autoimmune disease (HCC) 11/17/2018   History of iron deficiency anemia 11/09/2018   History of anxiety 11/09/2018    Past Medical History:  Diagnosis Date   Autoimmune disease (HCC)     Family History  Problem Relation Age of Onset   Fibromyalgia Mother    Raynaud syndrome Mother    Sjogren's syndrome Mother    Past Surgical History:  Procedure Laterality Date   CHOLECYSTECTOMY  2016   WISDOM TOOTH EXTRACTION  2018   Social History   Social History Narrative   Not on file   Immunization History  Administered Date(s) Administered   Moderna Sars-Covid-2 Vaccination 10/02/2019, 10/30/2019, 05/28/2020     Objective: Vital Signs: There were no vitals taken for this visit.   Physical Exam   Musculoskeletal Exam: ***  CDAI Exam: CDAI Score: -- Patient Global: --; Provider Global: -- Swollen: --; Tender: -- Joint Exam 11/14/2021   No joint exam has been  documented for this visit   There is currently no information documented on the homunculus. Go to the Rheumatology activity and complete the homunculus joint exam.  Investigation: No additional findings.  Imaging: No results found.  Recent Labs: Lab Results  Component Value Date   WBC 6.0 09/03/2020   HGB 12.9 09/03/2020   PLT 260 09/03/2020   NA 140 09/03/2020   K 4.1 09/03/2020   CL 107 09/03/2020   CO2 24 09/03/2020   GLUCOSE 94 09/03/2020   BUN 11 09/03/2020   CREATININE 0.73 09/03/2020   BILITOT 0.3 09/03/2020   ALKPHOS 68 09/22/2019   AST 16 09/03/2020   ALT 15 09/03/2020   PROT 6.7 09/03/2020   ALBUMIN 4.2 09/22/2019   CALCIUM 8.5 (L) 09/03/2020   GFRAA 135 09/03/2020    Speciality Comments: PLQ Eye Exam:10/10/2020 WNL [facility] Ophthalmology Follow up 1 year  Procedures:  No procedures performed Allergies: Patient has no known allergies.   Assessment / Plan:     Visit Diagnoses: No diagnosis found.  Orders: No orders of the defined types were placed in this encounter.  No orders of the defined types were placed in this encounter.   Face-to-face time spent with patient was *** minutes. Greater than 50% of time was spent in counseling and coordination of care.  Follow-Up Instructions: No follow-ups on file.   [facility], CMA  Note - This record has been created using Ellen Henri.  Chart creation errors have been sought, but may not always  have been located. Such creation errors do not reflect on  the standard of medical care.

## 2021-11-14 ENCOUNTER — Ambulatory Visit: Payer: Managed Care, Other (non HMO) | Admitting: Physician Assistant

## 2021-11-14 DIAGNOSIS — Z8659 Personal history of other mental and behavioral disorders: Secondary | ICD-10-CM

## 2021-11-14 DIAGNOSIS — R768 Other specified abnormal immunological findings in serum: Secondary | ICD-10-CM

## 2021-11-14 DIAGNOSIS — Z832 Family history of diseases of the blood and blood-forming organs and certain disorders involving the immune mechanism: Secondary | ICD-10-CM

## 2021-11-14 DIAGNOSIS — M359 Systemic involvement of connective tissue, unspecified: Secondary | ICD-10-CM

## 2021-11-14 DIAGNOSIS — Z862 Personal history of diseases of the blood and blood-forming organs and certain disorders involving the immune mechanism: Secondary | ICD-10-CM

## 2021-11-14 DIAGNOSIS — F5101 Primary insomnia: Secondary | ICD-10-CM

## 2021-11-14 DIAGNOSIS — Z79899 Other long term (current) drug therapy: Secondary | ICD-10-CM

## 2021-11-14 DIAGNOSIS — R5383 Other fatigue: Secondary | ICD-10-CM

## 2021-11-14 NOTE — Progress Notes (Unsigned)
Office Visit Note  Patient: Tiffany Mercer             Date of Birth: 08-Feb-1996           MRN: [id number]             PCP: Tiffany Mercer., MD Referring: Tiffany Mercer.,* Visit Date: 11/20/2021 Occupation: _0 @  Subjective:  Medication monitoring   History of Present Illness: Tiffany Mercer is a 26 y.o. female with history of autoimmune disease.  Patient was last seen in the office on 09/03/2020.  Patient is prescribed Plaquenil 200 mg 1 tablet by mouth twice daily.  She ran out of the prescription for Plaquenil about 1 month ago due to requiring an office visit as well as updated lab work and a Plaquenil eye examination.  She states that while taking Plaquenil she had noted significant improvement in her symptoms.  She has noticed an increase stiffness and aching since being off of Plaquenil.  She denies any joint swelling.  She states overall her energy level has been stable.  She states that she has had a flare of eczema on her hands and recently saw her dermatologist and had a steroid injection last week which has started to resolve the lesions on her hands.  She denies any other recent rashes.  She states that she has noticed some increased photosensitivity while taking Plaquenil but has been trying to avoid direct sun exposure wearing sunscreen daily.  She denies any hair loss.  She continues to have Raynaud's in her fingers and toes but denies any digital ulcerations.  She has not had any shortness of breath, pleuritic chest pain, or palpitations.  She denies any oral or nasal ulcerations.  She has not had any sicca symptoms. She denies any new medical conditions.  She has not had any recent infections.     Activities of Daily Living:  Patient reports morning stiffness for 2 hours.   Patient Denies nocturnal pain.  Difficulty dressing/grooming: Denies Difficulty climbing stairs: Denies Difficulty getting out of chair: Denies Difficulty using hands  for taps, buttons, cutlery, and/or writing: Denies  Review of Systems  Constitutional:  Positive for fatigue.  HENT:  Negative for mouth dryness.   Eyes:  Positive for dryness.  Respiratory:  Negative for shortness of breath.   Cardiovascular:  Negative for swelling in legs/feet.  Gastrointestinal:  Negative for constipation.  Endocrine: Negative for excessive thirst.  Genitourinary:  Negative for difficulty urinating.  Musculoskeletal:  Positive for morning stiffness.  Skin:  Negative for rash.  Allergic/Immunologic: Negative for susceptible to infections.  Neurological:  Negative for numbness.  Hematological:  Negative for bruising/bleeding tendency.  Psychiatric/Behavioral:  Positive for sleep disturbance.    PMFS History:  Patient Active Problem List   Diagnosis Date Noted   Family history of autoimmune disorder 11/17/2018   Autoimmune disease ([place]) 11/17/2018   History of iron deficiency anemia 11/09/2018   History of anxiety 11/09/2018    Past Medical History:  Diagnosis Date   Autoimmune disease ([place])     Family History  Problem Relation Age of Onset   Fibromyalgia Mother    Raynaud syndrome Mother    Sjogren's syndrome Mother    Past Surgical History:  Procedure Laterality Date   CHOLECYSTECTOMY  2016   WISDOM TOOTH EXTRACTION  2018   Social History   Social History Narrative   Not on file   Immunization History  Administered Date(s) Administered   [employer] Vaccination  10/02/2019, 10/30/2019, 05/28/2020     Objective: Vital Signs: BP 134/83 (BP Location: Left Arm, Patient Position: Sitting, Cuff Size: Large)   Pulse 86   Resp 15   Ht _0  (1.778 m)   Wt (!) 315 lb (142.9 kg)   BMI 45.20 kg/m    Physical Exam Vitals and nursing note reviewed.  Constitutional:      Appearance: She is well-developed.  HENT:     Head: Normocephalic and atraumatic.  Eyes:     Conjunctiva/sclera: Conjunctivae normal.  Cardiovascular:     Rate and  Rhythm: Normal rate and regular rhythm.     Heart sounds: Normal heart sounds.  Pulmonary:     Effort: Pulmonary effort is normal.     Breath sounds: Normal breath sounds.  Abdominal:     General: Bowel sounds are normal.     Palpations: Abdomen is soft.  Musculoskeletal:     Cervical back: Normal range of motion.  Skin:    General: Skin is warm and dry.     Capillary Refill: Capillary refill takes less than 2 seconds.  Neurological:     Mental Status: She is alert and oriented to person, place, and time.  Psychiatric:        Behavior: Behavior normal.     Musculoskeletal Exam: C-spine, thoracic spine, lumbar spine have good range of motion.  No midline spinal tenderness.  Shoulder joints, elbow joints, wrist joints, MCPs, PIPs, DIPs have good range of motion with no synovitis.  Complete fist formation bilaterally.  Hip joints have good range of motion with no groin pain.  Knee joints have good range of motion without warmth or effusion.  Ankle joints have good range of motion with no tenderness or joint swelling.  No tenderness over MTP joints.  CDAI Exam: CDAI Score: -- Patient Global: --; Provider Global: -- Swollen: --; Tender: -- Joint Exam 11/20/2021   No joint exam has been documented for this visit   There is currently no information documented on the homunculus. Go to the Rheumatology activity and complete the homunculus joint exam.  Investigation: No additional findings.  Imaging: No results found.  Recent Labs: Lab Results  Component Value Date   WBC 6.0 09/03/2020   HGB 12.9 09/03/2020   PLT 260 09/03/2020   NA 140 09/03/2020   K 4.1 09/03/2020   CL 107 09/03/2020   CO2 24 09/03/2020   GLUCOSE 94 09/03/2020   BUN 11 09/03/2020   CREATININE 0.73 09/03/2020   BILITOT 0.3 09/03/2020   ALKPHOS 68 09/22/2019   AST 16 09/03/2020   ALT 15 09/03/2020   PROT 6.7 09/03/2020   ALBUMIN 4.2 09/22/2019   CALCIUM 8.5 (L) 09/03/2020   GFRAA 135 09/03/2020     Speciality Comments: PLQ Eye Exam:10/10/2020 WNL [facility] Ophthalmology Follow up 1 year  Procedures:  No procedures performed Allergies: Patient has no known allergies.     Assessment / Plan:     Visit Diagnoses: Autoimmune disease ([place]) - ANA 1: 320 speckled, double-stranded DNA positive, RNP positive, C3-C4 normal, history of Raynauds, arthralgias, hair loss and fatigue: She has not had any signs or symptoms of a flare.  She was last seen in the office on 09/03/2020.  She is prescribed Plaquenil 200 mg 1 tablet by mouth twice daily.  She has been tolerating Plaquenil without any side effects.  She ran out of her prescription for Plaquenil 1 month ago due to requiring an updated Plaquenil eye exam, labs, and office  visit.  She has noticed some increased arthralgias and joint stiffness since discontinuing Plaquenil.  She has no synovitis on examination today.  Overall she has noticed a significant improvement in her symptoms while taking Plaquenil.  She has intermittent symptoms of Raynaud's but no digital ulcerations or signs of gangrene.  She has photosensitivity but has been avoiding direct sun exposure.  She was encouraged to wear sunscreen SPF 50 on a daily basis. No malar rash or alopecia noted.  She has not had any oral or nasal ulcerations.  No sicca symptoms.  She has not had any shortness of breath, pleuritic chest pain, palpitations.  Her lungs were clear to auscultation on examination today.   Lab work drawn on 09/03/2020 was reviewed today in the office: ANA 1: 80 MS, protein creatinine ratio within normal limits, RNP 2.0, ESR 22, complements within normal limits, and dsDNA 12.  The following lab work will be rechecked today.  A prescription for Plaquenil was sent to the pharmacy.  She was advised to schedule an updated Plaquenil eye examination.  She was advised to notify us if she develop signs or symptoms of a flare.  She will follow-up in the office in 5 months or sooner if needed.  -  Plan: CBC with Differential/Platelet, COMPLETE METABOLIC PANEL WITH GFR, Protein / creatinine ratio, urine, Anti-DNA antibody, double-stranded, Sedimentation rate, C3 and C4, ANA, hydroxychloroquine (PLAQUENIL) 200 MG tablet, RNP Antibody  High risk medication use - Plaquenil 200 mg 1 tablet by mouth twice daily.  PLQ Eye Exam:10/10/2020 WNL [facility] Ophthalmology Follow up 1 year.  She was advised to schedule an updated Plaquenil eye examination.  She was given a Plaquenil eye exam form to take with her to her next appointment. CBC and CMP drawn on 09/03/2020.  Orders for CBC and CMP were released today.  Discussed the importance of remaining compliant with lab work every 5 months to monitor for drug toxicity.   - Plan: CBC with Differential/Platelet, COMPLETE METABOLIC PANEL WITH GFR She has not had any new medical conditions.  No recent infections.  Primary insomnia: She has been sleeping well at night.  Other fatigue: Her energy level has been stable.  Anti-RNP antibodies present: RNP will be rechecked today.  Other medical conditions are listed as follows:  History of iron deficiency anemia  History of anxiety  Family history of autoimmune disorder  Orders: Orders Placed This Encounter  Procedures   CBC with Differential/Platelet   COMPLETE METABOLIC PANEL WITH GFR   Protein / creatinine ratio, urine   Anti-DNA antibody, double-stranded   Sedimentation rate   C3 and C4   ANA   RNP Antibody   Meds ordered this encounter  Medications   hydroxychloroquine (PLAQUENIL) 200 MG tablet    Sig: Take 1 tablet (200 mg total) by mouth 2 (two) times daily.    Dispense:  60 tablet    Refill:  2      Follow-Up Instructions: Return in about 5 months (around 04/22/2022) for Autoimmune Disease.   Ofilia Neas, PA-C  Note - This record has been created using Dragon software.  Chart creation errors have been sought, but may not always  have been located. Such creation errors do not  reflect on  the standard of medical care.

## 2021-11-20 ENCOUNTER — Ambulatory Visit: Payer: Managed Care, Other (non HMO) | Admitting: Physician Assistant

## 2021-11-20 ENCOUNTER — Encounter: Payer: Self-pay | Admitting: Physician Assistant

## 2021-11-20 VITALS — BP 134/83 | HR 86 | Resp 15 | Ht 70.0 in | Wt 315.0 lb

## 2021-11-20 DIAGNOSIS — F5101 Primary insomnia: Secondary | ICD-10-CM | POA: Diagnosis not present

## 2021-11-20 DIAGNOSIS — Z8659 Personal history of other mental and behavioral disorders: Secondary | ICD-10-CM

## 2021-11-20 DIAGNOSIS — R768 Other specified abnormal immunological findings in serum: Secondary | ICD-10-CM

## 2021-11-20 DIAGNOSIS — R5383 Other fatigue: Secondary | ICD-10-CM | POA: Diagnosis not present

## 2021-11-20 DIAGNOSIS — M359 Systemic involvement of connective tissue, unspecified: Secondary | ICD-10-CM | POA: Diagnosis not present

## 2021-11-20 DIAGNOSIS — Z832 Family history of diseases of the blood and blood-forming organs and certain disorders involving the immune mechanism: Secondary | ICD-10-CM

## 2021-11-20 DIAGNOSIS — Z862 Personal history of diseases of the blood and blood-forming organs and certain disorders involving the immune mechanism: Secondary | ICD-10-CM

## 2021-11-20 DIAGNOSIS — Z79899 Other long term (current) drug therapy: Secondary | ICD-10-CM

## 2021-11-20 MED ORDER — HYDROXYCHLOROQUINE SULFATE 200 MG PO TABS: 200.0000 mg | ORAL_TABLET | Freq: Two times a day (BID) | ORAL | 2 refills | Status: DC

## 2021-11-23 LAB — CBC WITH DIFFERENTIAL/PLATELET
Absolute Monocytes: 727 cells/uL (ref 200–950)
Basophils Absolute: 64 cells/uL (ref 0–200)
Basophils Relative: 0.7 %
Eosinophils Absolute: 120 cells/uL (ref 15–500)
Eosinophils Relative: 1.3 %
HCT: 40.6 % (ref 35.0–45.0)
Hemoglobin: 13 g/dL (ref 11.7–15.5)
Lymphs Abs: 2567 cells/uL (ref 850–3900)
MCH: 27 pg (ref 27.0–33.0)
MCHC: 32 g/dL (ref 32.0–36.0)
MCV: 84.2 fL (ref 80.0–100.0)
MPV: 9.4 fL (ref 7.5–12.5)
Monocytes Relative: 7.9 %
Neutro Abs: 5722 cells/uL (ref 1500–7800)
Neutrophils Relative %: 62.2 %
Platelets: 312 10*3/uL (ref 140–400)
RBC: 4.82 10*6/uL (ref 3.80–5.10)
RDW: 13.1 % (ref 11.0–15.0)
Total Lymphocyte: 27.9 %
WBC: 9.2 10*3/uL (ref 3.8–10.8)

## 2021-11-23 LAB — PROTEIN / CREATININE RATIO, URINE
Creatinine, Urine: 126 mg/dL (ref 20–275)
Protein/Creat Ratio: 119 mg/g creat (ref 24–184)
Protein/Creatinine Ratio: 0.119 mg/mg creat (ref 0.024–0.184)
Total Protein, Urine: 15 mg/dL (ref 5–24)

## 2021-11-23 LAB — COMPLETE METABOLIC PANEL WITH GFR
AG Ratio: 1.2 (calc) (ref 1.0–2.5)
ALT: 14 U/L (ref 6–29)
AST: 12 U/L (ref 10–30)
Albumin: 3.8 g/dL (ref 3.6–5.1)
Alkaline phosphatase (APISO): 93 U/L (ref 31–125)
BUN: 12 mg/dL (ref 7–25)
CO2: 27 mmol/L (ref 20–32)
Calcium: 8.7 mg/dL (ref 8.6–10.2)
Chloride: 105 mmol/L (ref 98–110)
Creat: 0.87 mg/dL (ref 0.50–0.96)
Globulin: 3.3 g/dL (calc) (ref 1.9–3.7)
Glucose, Bld: 88 mg/dL (ref 65–99)
Potassium: 4.1 mmol/L (ref 3.5–5.3)
Sodium: 139 mmol/L (ref 135–146)
Total Bilirubin: 0.3 mg/dL (ref 0.2–1.2)
Total Protein: 7.1 g/dL (ref 6.1–8.1)
eGFR: 95 mL/min/{1.73_m2} (ref >=60)

## 2021-11-23 LAB — C3 AND C4
C3 Complement: 188 mg/dL (ref 83–193)
C4 Complement: 29 mg/dL (ref 15–57)

## 2021-11-23 LAB — ANTI-NUCLEAR AB-TITER (ANA TITER): ANA Titer 1: 1:80 {titer} — ABNORMAL HIGH

## 2021-11-23 LAB — ANTI-DNA ANTIBODY, DOUBLE-STRANDED: ds DNA Ab: 16 IU/mL — ABNORMAL HIGH

## 2021-11-23 LAB — ANA: Anti Nuclear Antibody (ANA): POSITIVE — AB

## 2021-11-23 LAB — RNP ANTIBODY: Ribonucleic Protein(ENA) Antibody, IgG: 1.7 AI — AB

## 2021-11-23 LAB — SEDIMENTATION RATE: Sed Rate: 33 mm/h — ABNORMAL HIGH (ref 0–20)

## 2021-11-24 NOTE — Progress Notes (Signed)
CBC and CMP are normal.  Urine protein is negative.  Complements are normal.  Double-stranded DNA is elevated, sed rate is elevated, ANA low titer positive, RNP positive.   Autoimmune antibody titers are rising.  Patient ran out of hydroxychloroquine.  Patient should resume hydroxychloroquine as prescribed.  She should have a follow-up visit with [place] in 3 months and repeat labs in 3 months.  She is at an increased risk of developing a flare.  If she develops a flare she should contact us.

## 2022-02-13 ENCOUNTER — Other Ambulatory Visit: Payer: Self-pay | Admitting: Physician Assistant

## 2022-02-13 DIAGNOSIS — M359 Systemic involvement of connective tissue, unspecified: Secondary | ICD-10-CM

## 2022-02-13 NOTE — Telephone Encounter (Signed)
Next Visit: 04/24/2022  Last Visit: 11/20/2021  Labs: 11/20/2021 CBC and CMP are normal.  Urine protein is negative.  Complements are normal.  Double-stranded DNA is elevated, sed rate is elevated, ANA low titer positive, RNP positive.   Autoimmune antibody titers are rising.  Patient ran out of hydroxychloroquine.  Patient should resume hydroxychloroquine as prescribed.  She should have a follow-up visit with [place] in 3 months and repeat labs in 3 months.  She is at an increased risk of developing a flare.  If she develops a flare she should contact us.  Eye exam: 10/10/2020 WNL [facility] eye exam due  Current Dose per office note 11/20/2021: Plaquenil 200 mg 1 tablet by mouth twice daily.   DX: Autoimmune disease   Last Fill: 11/20/2021  Okay to refill Plaquenil?

## 2022-04-11 NOTE — Progress Notes (Deleted)
   Office Visit Note  Patient: Tiffany Mercer             Date of Birth: [birth year]           MRN: [id number]             PCP: Loraine Leriche., MD Referring: Loraine Leriche.,* Visit Date: 04/24/2022 Occupation: @GUAROCC @  Subjective:  No chief complaint on file.   History of Present Illness: Tiffany Mercer is a 26 y.o. female ***   Activities of Daily Living:  Patient reports morning stiffness for *** {minute/hour:19697}.   Patient {ACTIONS;DENIES/REPORTS:21021675::"Denies"} nocturnal pain.  Difficulty dressing/grooming: {ACTIONS;DENIES/REPORTS:21021675::"Denies"} Difficulty climbing stairs: {ACTIONS;DENIES/REPORTS:21021675::"Denies"} Difficulty getting out of chair: {ACTIONS;DENIES/REPORTS:21021675::"Denies"} Difficulty using hands for taps, buttons, cutlery, and/or writing: {ACTIONS;DENIES/REPORTS:21021675::"Denies"}  No Rheumatology ROS completed.   PMFS History:  Patient Active Problem List   Diagnosis Date Noted   Family history of autoimmune disorder 11/17/2018   Autoimmune disease ([place]) 11/17/2018   History of iron deficiency anemia 11/09/2018   History of anxiety 11/09/2018    Past Medical History:  Diagnosis Date   Autoimmune disease ([place])     Family History  Problem Relation Age of Onset   Fibromyalgia Mother    Raynaud syndrome Mother    Sjogren's syndrome Mother    Past Surgical History:  Procedure Laterality Date   CHOLECYSTECTOMY  2016   WISDOM TOOTH EXTRACTION  2018   Social History   Social History Narrative   Not on file   Immunization History  Administered Date(s) Administered   Moderna Sars-Covid-2 Vaccination 10/02/2019, 10/30/2019, 05/28/2020     Objective: Vital Signs: There were no vitals taken for this visit.   Physical Exam   Musculoskeletal Exam: ***  CDAI Exam: CDAI Score: -- Patient Global: --; Provider Global: -- Swollen: --; Tender: -- Joint Exam 04/24/2022   No joint exam has been  documented for this visit   There is currently no information documented on the homunculus. Go to the Rheumatology activity and complete the homunculus joint exam.  Investigation: No additional findings.  Imaging: No results found.  Recent Labs: Lab Results  Component Value Date   WBC 9.2 11/20/2021   HGB 13.0 11/20/2021   PLT 312 11/20/2021   NA 139 11/20/2021   K 4.1 11/20/2021   CL 105 11/20/2021   CO2 27 11/20/2021   GLUCOSE 88 11/20/2021   BUN 12 11/20/2021   CREATININE 0.87 11/20/2021   BILITOT 0.3 11/20/2021   ALKPHOS 68 09/22/2019   AST 12 11/20/2021   ALT 14 11/20/2021   PROT 7.1 11/20/2021   ALBUMIN 4.2 09/22/2019   CALCIUM 8.7 11/20/2021   GFRAA 135 09/03/2020    Speciality Comments: PLQ Eye Exam:10/10/2020 WNL [facility] Ophthalmology Follow up 1 year  Procedures:  No procedures performed Allergies: Patient has no known allergies.   Assessment / Plan:     Visit Diagnoses: No diagnosis found.  Orders: No orders of the defined types were placed in this encounter.  No orders of the defined types were placed in this encounter.   Face-to-face time spent with patient was *** minutes. Greater than 50% of time was spent in counseling and coordination of care.  Follow-Up Instructions: No follow-ups on file.   Earnestine Mealing, CMA  Note - This record has been created using Editor, commissioning.  Chart creation errors have been sought, but may not always  have been located. Such creation errors do not reflect on  the standard of medical care.

## 2022-04-24 ENCOUNTER — Ambulatory Visit: Payer: Managed Care, Other (non HMO) | Attending: Rheumatology | Admitting: Rheumatology

## 2022-04-24 DIAGNOSIS — M359 Systemic involvement of connective tissue, unspecified: Secondary | ICD-10-CM

## 2022-04-24 DIAGNOSIS — Z862 Personal history of diseases of the blood and blood-forming organs and certain disorders involving the immune mechanism: Secondary | ICD-10-CM

## 2022-04-24 DIAGNOSIS — F5101 Primary insomnia: Secondary | ICD-10-CM

## 2022-04-24 DIAGNOSIS — Z8659 Personal history of other mental and behavioral disorders: Secondary | ICD-10-CM

## 2022-04-24 DIAGNOSIS — Z832 Family history of diseases of the blood and blood-forming organs and certain disorders involving the immune mechanism: Secondary | ICD-10-CM

## 2022-04-24 DIAGNOSIS — R768 Other specified abnormal immunological findings in serum: Secondary | ICD-10-CM

## 2022-04-24 DIAGNOSIS — Z79899 Other long term (current) drug therapy: Secondary | ICD-10-CM

## 2022-04-24 DIAGNOSIS — R5383 Other fatigue: Secondary | ICD-10-CM

## 2022-04-27 IMAGING — DX DG CHEST 2V
2 series · 2 of 2 positions shown · non-contrast
Comparison: None.

CLINICAL DATA: Short of breath autoimmune disease

EXAM:
CHEST - 2 VIEW

[chest pa]
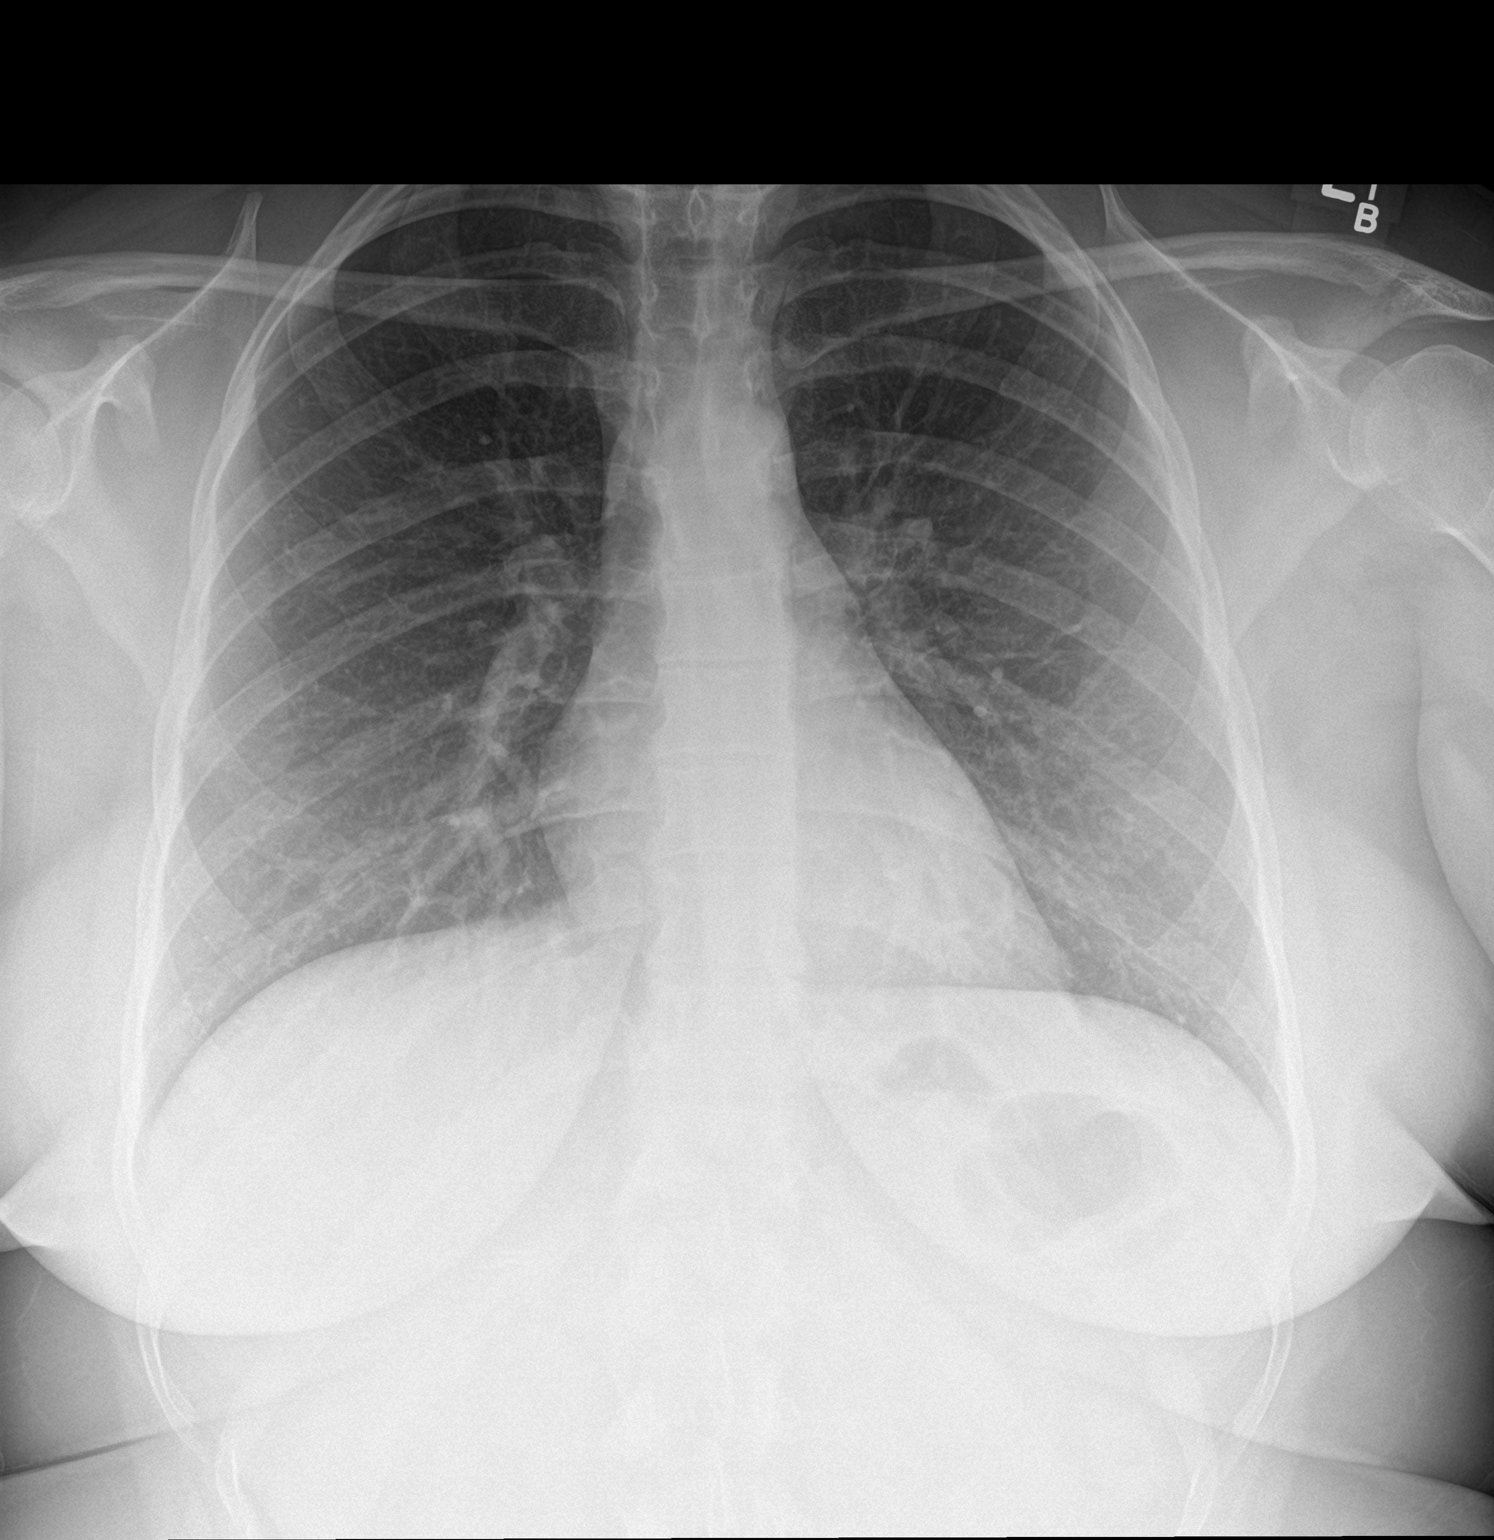

[chest lat]
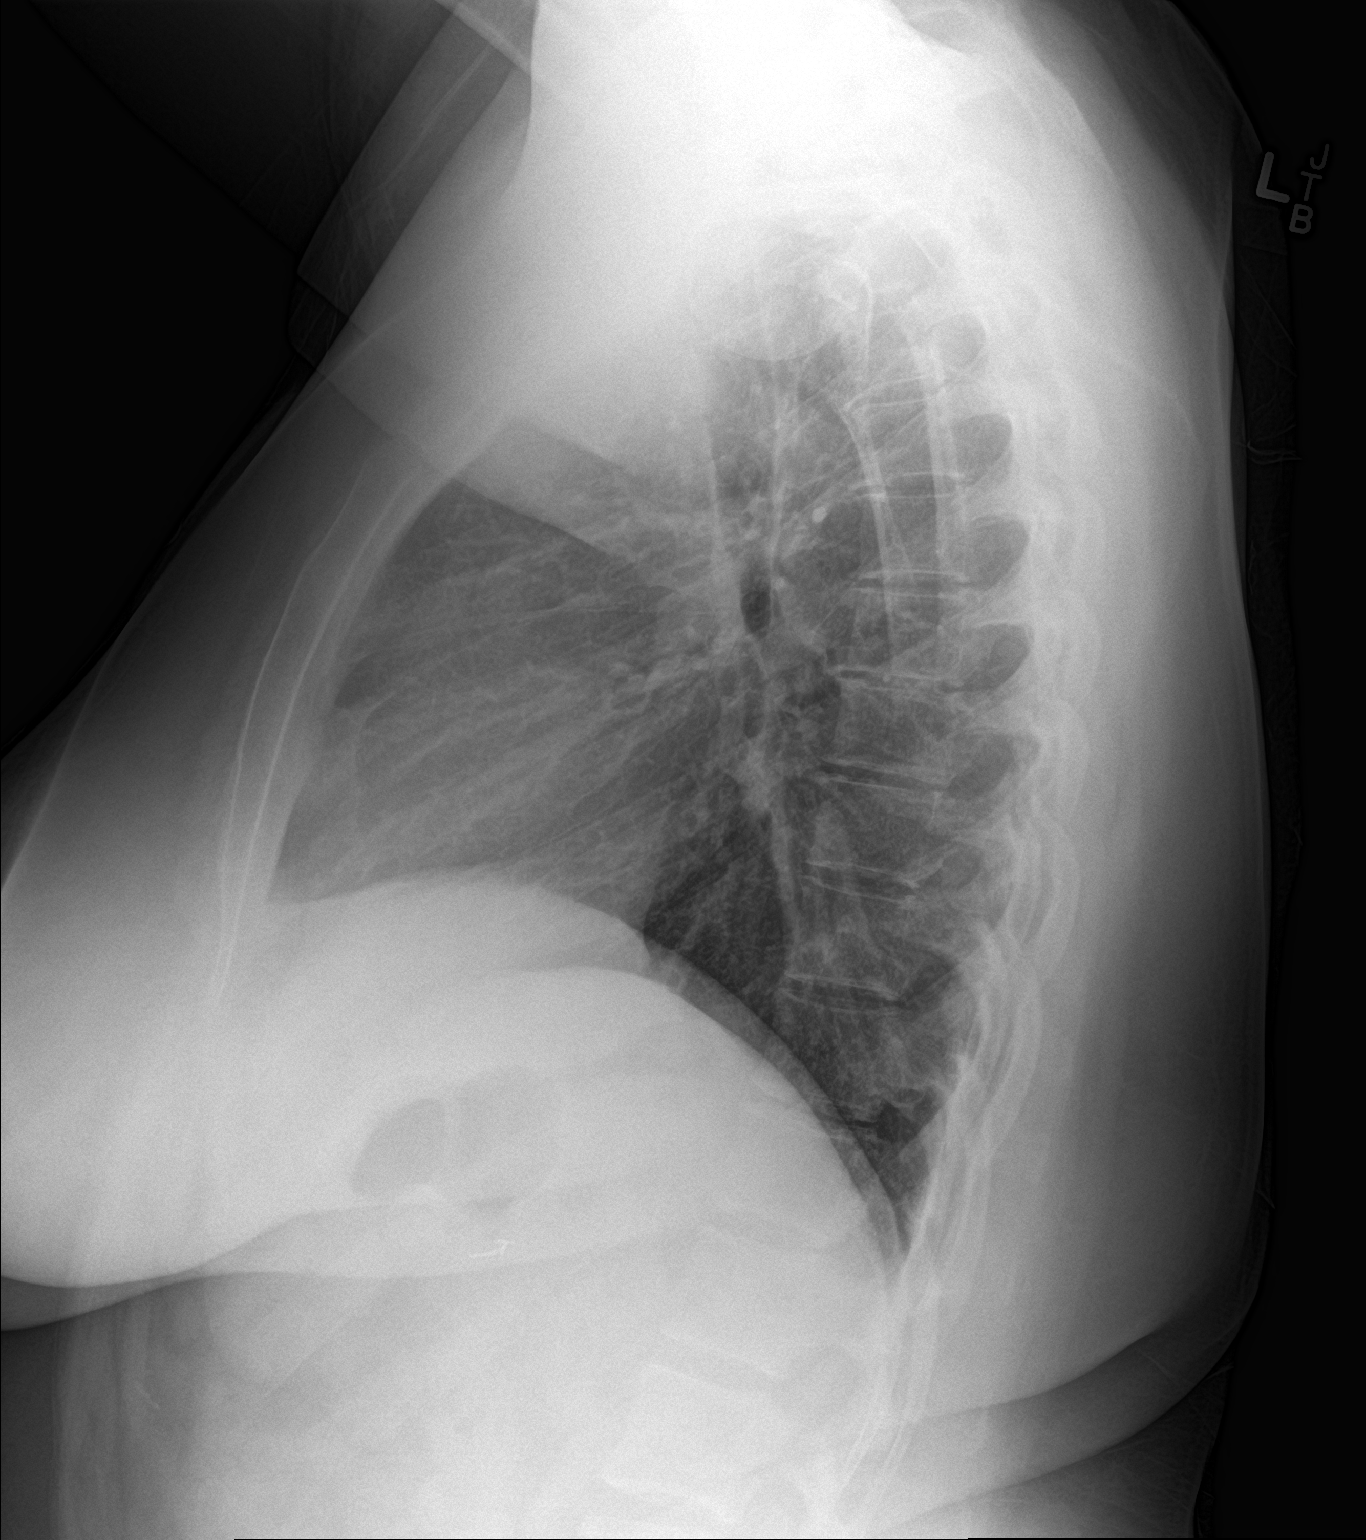

[2 of 2 positions shown; findings below may reference images not displayed]

FINDINGS: The heart size and mediastinal contours are within normal limits.
Both lungs are clear. The visualized skeletal structures are
unremarkable.
IMPRESSION: No active cardiopulmonary disease.

## 2022-06-02 ENCOUNTER — Other Ambulatory Visit: Payer: Self-pay | Admitting: Physician Assistant

## 2022-06-02 DIAGNOSIS — M359 Systemic involvement of connective tissue, unspecified: Secondary | ICD-10-CM

## 2022-07-11 ENCOUNTER — Other Ambulatory Visit: Payer: Self-pay | Admitting: Physician Assistant

## 2022-07-11 DIAGNOSIS — M359 Systemic involvement of connective tissue, unspecified: Secondary | ICD-10-CM

## 2022-12-12 NOTE — Progress Notes (Signed)
Office Visit Note  Patient: Tiffany Mercer             Date of Birth: [birth year]           MRN: [id number]             PCP: Cheron Schaumann., MD Referring: Cheron Schaumann.,* Visit Date: 12/26/2022 Occupation: @GUAROCC @  Subjective:  Increased joint pain  History of Present Illness: Tiffany Mercer is a 27 y.o. 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  She returns today after her last visit in March 2022.  Patient states that she was living in [place] and recently graduated.  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  She states she has been experiencing increased joint pain and discomfort and morning stiffness.  She complains of discomfort in the lower back without any radiculopathy.  She has been also experiencing increased discomfort in her knee joints.  She also has some nocturnal pain.  He has not noticed any joint swelling.  She gives history of dry eyes.  She denies any history of oral ulcers, nasal ulcers, malar rash or Raynaud's phenomenon.  Raynauds is more active during the winter months.  She continues to have photosensitivity.  She has been using sunscreen.  She ran out of hydroxychloroquine in November.  Patient states she had eye examination this morning.    Activities of Daily Living:  Patient reports morning stiffness for 20-30 minutes.   Patient Reports nocturnal pain.  Difficulty dressing/grooming: Denies Difficulty climbing stairs: Denies Difficulty getting out of chair: Denies Difficulty using hands for taps, buttons, cutlery, and/or writing: Denies  Review of Systems  Constitutional:  Positive for fatigue.  HENT:  Negative for mouth sores and mouth dryness.   Eyes:  Positive for dryness.  Respiratory:  Negative for shortness of breath.   Cardiovascular:  Negative for chest pain and palpitations.  Gastrointestinal:  Negative for blood in stool, constipation and diarrhea.  Endocrine: Positive for increased urination.   Genitourinary:  Negative for painful urination and involuntary urination.  Musculoskeletal:  Positive for joint pain, joint pain and morning stiffness. Negative for gait problem, joint swelling, myalgias, muscle weakness, muscle tenderness and myalgias.  Skin:  Positive for sensitivity to sunlight. Negative for color change, rash and hair loss.  Allergic/Immunologic: Positive for susceptible to infections.  Neurological:  Negative for dizziness and headaches.  Hematological:  Negative for swollen glands.  Psychiatric/Behavioral:  Negative for depressed mood and sleep disturbance. The patient is nervous/anxious.     PMFS History:  Patient Active Problem List   Diagnosis Date Noted   Family history of autoimmune disorder 11/17/2018   Autoimmune disease (HCC) 11/17/2018   History of iron deficiency anemia 11/09/2018   History of anxiety 11/09/2018    Past Medical History:  Diagnosis Date   Autoimmune disease (HCC)     Family History  Problem Relation Age of Onset   Fibromyalgia Mother    Raynaud syndrome Mother    Sjogren's syndrome Mother    Past Surgical History:  Procedure Laterality Date   CHOLECYSTECTOMY  2016   WISDOM TOOTH EXTRACTION  2018   Social History   Social History Narrative   Not on file   Immunization History  Administered Date(s) Administered   Moderna Sars-Covid-2 Vaccination 10/02/2019, 10/30/2019, 05/28/2020     Objective: Vital Signs: BP 128/84 (BP Location: Left Arm, Patient Position: Sitting, Cuff Size: Normal)   Pulse 90   Resp 16   Ht 5'  10" (1.778 m)   Wt (!) 312 lb 9.6 oz (141.8 kg)   BMI 44.85 kg/m    Physical Exam Vitals and nursing note reviewed.  Constitutional:      Appearance: She is well-developed.  HENT:     Head: Normocephalic and atraumatic.  Eyes:     Conjunctiva/sclera: Conjunctivae normal.     Comments: Bilateral pupils were dilated because she had eye examination this morning.  Cardiovascular:     Rate and Rhythm:  Normal rate and regular rhythm.     Heart sounds: Normal heart sounds.  Pulmonary:     Effort: Pulmonary effort is normal.     Breath sounds: Normal breath sounds.  Abdominal:     General: Bowel sounds are normal.     Palpations: Abdomen is soft.  Musculoskeletal:     Cervical back: Normal range of motion.  Lymphadenopathy:     Cervical: No cervical adenopathy.  Skin:    General: Skin is warm and dry.     Capillary Refill: Capillary refill takes less than 2 seconds.  Neurological:     Mental Status: She is alert and oriented to person, place, and time.  Psychiatric:        Behavior: Behavior normal.      Musculoskeletal Exam: Cervical, thoracic and lumbar spine were in good range of motion.  Shoulder joints, elbow joints, wrist joints, MCPs PIPs and DIPs were in good range of motion with no synovitis.  Hip joints, knee joints, ankles, MTPs and PIPs been good range of motion with no synovitis.  She had no point tenderness over lumbar spine.  She had good mobility in her lumbar spine with some discomfort.  CDAI Exam: CDAI Score: -- Patient Global: --; Provider Global: -- Swollen: --; Tender: -- Joint Exam 12/26/2022   No joint exam has been documented for this visit   There is currently no information documented on the homunculus. Go to the Rheumatology activity and complete the homunculus joint exam.  Investigation: No additional findings.  Imaging: No results found.  Recent Labs: Lab Results  Component Value Date   WBC 9.2 11/20/2021   HGB 13.0 11/20/2021   PLT 312 11/20/2021   NA 139 11/20/2021   K 4.1 11/20/2021   CL 105 11/20/2021   CO2 27 11/20/2021   GLUCOSE 88 11/20/2021   BUN 12 11/20/2021   CREATININE 0.87 11/20/2021   BILITOT 0.3 11/20/2021   ALKPHOS 68 09/22/2019   AST 12 11/20/2021   ALT 14 11/20/2021   PROT 7.1 11/20/2021   ALBUMIN 4.2 09/22/2019   CALCIUM 8.7 11/20/2021   GFRAA 135 09/03/2020    Speciality Comments: PLQ Eye Exam:10/10/2020  [facility] Follow up 1 year  PLQ eye exam: 12/26/2022 per patient, awaiting report  Procedures:  No procedures performed Allergies: Other   Assessment / Plan:     Visit Diagnoses: Other organ or system involvement in systemic lupus erythematosus (HCC) - ANA 1: 320 speckled, double-stranded DNA positive, RNP positive, C3-C4 normal, history of Raynauds, arthralgias, hair loss and fatigue: -Patient ran out of hydroxychloroquine in November 2023.  She was living in [place] for her grad school.  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  Patient states she will be able to come for regular appointments and will be on hydroxychloroquine a regular basis.  She continues to have dry eyes, photosensitivity, joint pain.  She has not noticed any joint swelling.  Detailed counsel regarding systemic lupus was  provided.  Need for taking hydroxychloroquine on a regular basis was discussed.  I will check autoimmune labs today.  Use of sunscreen was also emphasized.  Plan: hydroxychloroquine (PLAQUENIL) 200 MG tablet, Protein / creatinine ratio, urine, ANA, Anti-DNA antibody, double-stranded, C3 and C4, Sedimentation rate, RNP Antibody, Sjogrens syndrome-A extractable nuclear antibody.  Will contact her once the lab results are available.  High risk medication use - Plaquenil 200 mg 1 tablet by mouth twice daily.  She ran out of Plaquenil in November 2023.  Prescription refill for Plaquenil was given today.  PLQ Eye Exam: December 26, 2022.  Patient had eye examination this morning her eyes were dilated.- Plan: CBC with Differential/Platelet, COMPLETE METABOLIC PANEL WITH GFR  Primary insomnia-her insomnia is better.  Good sleep hygiene was discussed.  Other fatigue-she continues to have some fatigue.  She has been out of hydroxychloroquine.  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  She had no point tenderness.  A handout on back exercises was given.  Chronic pain of both knees-patient complains of pain and discomfort in her bilateral knee joints.  No warmth swelling or effusion was noted.  A handout on lower extremity exercises was given.  BMI 44.85-increased risk of heart disease with autoimmune disease was discussed.  Dietary modifications and exercise were emphasized.  A handout was placed in the AVS.  History of iron deficiency anemia-hemoglobin is normal at 13.0 November 20, 2021.  History of anxiety  Family history of autoimmune disorder  Orders: Orders Placed This Encounter  Procedures   Protein / creatinine ratio, urine   CBC with Differential/Platelet   COMPLETE METABOLIC PANEL WITH GFR   ANA   Anti-DNA antibody, double-stranded   C3 and C4   Sedimentation rate   RNP Antibody   Sjogrens syndrome-A extractable nuclear antibody   Meds ordered this encounter  Medications   hydroxychloroquine (PLAQUENIL) 200 MG tablet    Sig: Take 1 tablet (200 mg total) by mouth 2 (two) times daily.    Dispense:  180 tablet    Refill:  0     Follow-Up Instructions: Return in about 5 months (around 05/28/2023) for SLE.   Pollyann Savoy, MD  Note - This record has been created using Animal nutritionist.  Chart creation errors have been sought, but may not always  have been located. Such creation errors do not reflect on  the standard of medical care.

## 2022-12-26 ENCOUNTER — Ambulatory Visit: Payer: Managed Care, Other (non HMO) | Attending: Rheumatology | Admitting: Rheumatology

## 2022-12-26 ENCOUNTER — Encounter: Payer: Self-pay | Admitting: Rheumatology

## 2022-12-26 VITALS — BP 128/84 | HR 90 | Resp 16 | Ht 70.0 in | Wt 312.6 lb

## 2022-12-26 DIAGNOSIS — M545 Low back pain, unspecified: Secondary | ICD-10-CM | POA: Diagnosis not present

## 2022-12-26 DIAGNOSIS — G8929 Other chronic pain: Secondary | ICD-10-CM

## 2022-12-26 DIAGNOSIS — F5101 Primary insomnia: Secondary | ICD-10-CM

## 2022-12-26 DIAGNOSIS — Z832 Family history of diseases of the blood and blood-forming organs and certain disorders involving the immune mechanism: Secondary | ICD-10-CM

## 2022-12-26 DIAGNOSIS — Z79899 Other long term (current) drug therapy: Secondary | ICD-10-CM

## 2022-12-26 DIAGNOSIS — M25562 Pain in left knee: Secondary | ICD-10-CM

## 2022-12-26 DIAGNOSIS — R768 Other specified abnormal immunological findings in serum: Secondary | ICD-10-CM

## 2022-12-26 DIAGNOSIS — M25561 Pain in right knee: Secondary | ICD-10-CM

## 2022-12-26 DIAGNOSIS — Z6841 Body Mass Index (BMI) 40.0 and over, adult: Secondary | ICD-10-CM

## 2022-12-26 DIAGNOSIS — Z862 Personal history of diseases of the blood and blood-forming organs and certain disorders involving the immune mechanism: Secondary | ICD-10-CM

## 2022-12-26 DIAGNOSIS — M3219 Other organ or system involvement in systemic lupus erythematosus: Secondary | ICD-10-CM | POA: Diagnosis not present

## 2022-12-26 DIAGNOSIS — Z8659 Personal history of other mental and behavioral disorders: Secondary | ICD-10-CM

## 2022-12-26 DIAGNOSIS — R5383 Other fatigue: Secondary | ICD-10-CM

## 2022-12-26 DIAGNOSIS — M359 Systemic involvement of connective tissue, unspecified: Secondary | ICD-10-CM

## 2022-12-26 MED ORDER — HYDROXYCHLOROQUINE SULFATE 200 MG PO TABS: 200.0000 mg | ORAL_TABLET | Freq: Two times a day (BID) | ORAL | 0 refills | Status: DC

## 2022-12-26 NOTE — Patient Instructions (Signed)
Vaccines You are taking a medication(s) that can suppress your immune system.  The following immunizations are recommended: Flu annually Covid-19  Td/Tdap (tetanus, diphtheria, pertussis) every 10 years Pneumonia (Prevnar 15 then Pneumovax 23 at least 1 year apart.  Alternatively, can take Prevnar 20 without needing additional dose) Shingrix: 2 doses from 4 weeks to 6 months apart  Please check with your PCP to make sure you are up to date.   Heart Disease Prevention   Your inflammatory disease increases your risk of heart disease which includes heart attack, stroke, atrial fibrillation (irregular heartbeats), high blood pressure, heart failure and atherosclerosis (plaque in the arteries).  It is important to reduce your risk by:   Keep blood pressure, cholesterol, and blood sugar at healthy levels   Smoking Cessation   Maintain a healthy weight  BMI 20-25   Eat a healthy diet  Plenty of fresh fruit, vegetables, and whole grains  Limit saturated fats, foods high in sodium, and added sugars  DASH and Mediterranean diet   Increase physical activity  Recommend moderate physically activity for 150 minutes per week/ 30 minutes a day for five days a week These can be broken up into three separate ten-minute sessions during the day.   Reduce Stress  Meditation, slow breathing exercises, yoga, coloring books  Dental visits twice a year    Low Back Sprain or Strain Rehab Ask your health care provider which exercises are safe for you. Do exercises exactly as told by your health care provider and adjust them as directed. It is normal to feel mild stretching, pulling, tightness, or discomfort as you do these exercises. Stop right away if you feel sudden pain or your pain gets worse. Do not begin these exercises until told by your health care provider. Stretching and range-of-motion exercises These exercises warm up your muscles and joints and improve the movement and flexibility of your back.  These exercises also help to relieve pain, numbness, and tingling. Lumbar rotation  Lie on your back on a firm bed or the floor with your knees bent. Straighten your arms out to your sides so each arm forms a 90-degree angle (right angle) with a side of your body. Slowly move (rotate) both of your knees to one side of your body until you feel a stretch in your lower back (lumbar). Try not to let your shoulders lift off the floor. Hold this position for __________ seconds. Tense your abdominal muscles and slowly move your knees back to the starting position. Repeat this exercise on the other side of your body. Repeat __________ times. Complete this exercise __________ times a day. Single knee to chest  Lie on your back on a firm bed or the floor with both legs straight. Bend one of your knees. Use your hands to move your knee up toward your chest until you feel a gentle stretch in your lower back and buttock. Hold your leg in this position by holding on to the front of your knee. Keep your other leg as straight as possible. Hold this position for __________ seconds. Slowly return to the starting position. Repeat with your other leg. Repeat __________ times. Complete this exercise __________ times a day. Prone extension on elbows  Lie on your abdomen on a firm bed or the floor (prone position). Prop yourself up on your elbows. Use your arms to help lift your chest up until you feel a gentle stretch in your abdomen and your lower back. This will place some of  your body weight on your elbows. If this is uncomfortable, try stacking pillows under your chest. Your hips should stay down, against the surface that you are lying on. Keep your hip and back muscles relaxed. Hold this position for __________ seconds. Slowly relax your upper body and return to the starting position. Repeat __________ times. Complete this exercise __________ times a day. Strengthening exercises These exercises build  strength and endurance in your back. Endurance is the ability to use your muscles for a long time, even after they get tired. Pelvic tilt This exercise strengthens the muscles that lie deep in the abdomen. Lie on your back on a firm bed or the floor with your legs extended. Bend your knees so they are pointing toward the ceiling and your feet are flat on the floor. Tighten your lower abdominal muscles to press your lower back against the floor. This motion will tilt your pelvis so your tailbone points up toward the ceiling instead of pointing to your feet or the floor. To help with this exercise, you may place a small towel under your lower back and try to push your back into the towel. Hold this position for __________ seconds. Let your muscles relax completely before you repeat this exercise. Repeat __________ times. Complete this exercise __________ times a day. Alternating arm and leg raises  Get on your hands and knees on a firm surface. If you are on a hard floor, you may want to use padding, such as an exercise mat, to cushion your knees. Line up your arms and legs. Your hands should be directly below your shoulders, and your knees should be directly below your hips. Lift your left leg behind you. At the same time, raise your right arm and straighten it in front of you. Do not lift your leg higher than your hip. Do not lift your arm higher than your shoulder. Keep your abdominal and back muscles tight. Keep your hips facing the ground. Do not arch your back. Keep your balance carefully, and do not hold your breath. Hold this position for __________ seconds. Slowly return to the starting position. Repeat with your right leg and your left arm. Repeat __________ times. Complete this exercise __________ times a day. Abdominal set with straight leg raise  Lie on your back on a firm bed or the floor. Bend one of your knees and keep your other leg straight. Tense your abdominal muscles  and lift your straight leg up, 4-6 inches (10-15 cm) off the ground. Keep your abdominal muscles tight and hold this position for __________ seconds. Do not hold your breath. Do not arch your back. Keep it flat against the ground. Keep your abdominal muscles tense as you slowly lower your leg back to the starting position. Repeat with your other leg. Repeat __________ times. Complete this exercise __________ times a day. Single leg lower with bent knees Lie on your back on a firm bed or the floor. Tense your abdominal muscles and lift your feet off the floor, one foot at a time, so your knees and hips are bent in 90-degree angles (right angles). Your knees should be over your hips and your lower legs should be parallel to the floor. Keeping your abdominal muscles tense and your knee bent, slowly lower one of your legs so your toe touches the ground. Lift your leg back up to return to the starting position. Do not hold your breath. Do not let your back arch. Keep your back flat against the  ground. Repeat with your other leg. Repeat __________ times. Complete this exercise __________ times a day. Posture and body mechanics Good posture and healthy body mechanics can help to relieve stress in your body's tissues and joints. Body mechanics refers to the movements and positions of your body while you do your daily activities. Posture is part of body mechanics. Good posture means: Your spine is in its natural S-curve position (neutral). Your shoulders are pulled back slightly. Your head is not tipped forward (neutral). Follow these guidelines to improve your posture and body mechanics in your everyday activities. Standing  When standing, keep your spine neutral and your feet about hip-width apart. Keep a slight bend in your knees. Your ears, shoulders, and hips should line up. When you do a task in which you stand in one place for a long time, place one foot up on a stable object that is 2-4  inches (5-10 cm) high, such as a footstool. This helps keep your spine neutral. Sitting  When sitting, keep your spine neutral and keep your feet flat on the floor. Use a footrest, if necessary, and keep your thighs parallel to the floor. Avoid rounding your shoulders, and avoid tilting your head forward. When working at a desk or a computer, keep your desk at a height where your hands are slightly lower than your elbows. Slide your chair under your desk so you are close enough to maintain good posture. When working at a computer, place your monitor at a height where you are looking straight ahead and you do not have to tilt your head forward or downward to look at the screen. Resting When lying down and resting, avoid positions that are most painful for you. If you have pain with activities such as sitting, bending, stooping, or squatting, lie in a position in which your body does not bend very much. For example, avoid curling up on your side with your arms and knees near your chest (fetal position). If you have pain with activities such as standing for a long time or reaching with your arms, lie with your spine in a neutral position and bend your knees slightly. Try the following positions: Lying on your side with a pillow between your knees. Lying on your back with a pillow under your knees. Lifting  When lifting objects, keep your feet at least shoulder-width apart and tighten your abdominal muscles. Bend your knees and hips and keep your spine neutral. It is important to lift using the strength of your legs, not your back. Do not lock your knees straight out. Always ask for help to lift heavy or awkward objects. This information is not intended to replace advice given to you by your health care provider. Make sure you discuss any questions you have with your health care provider. Document Revised: 08/20/2020 Document Reviewed: 08/20/2020 Elsevier Patient Education  2024 [employer] Exercises for Chronic Knee Pain Chronic knee pain is pain that lasts longer than 3 months. For most people with chronic knee pain, exercise and weight loss is an important part of treatment. Your health care provider may want you to focus on: Making the muscles that support your knee stronger. This can take pressure off your knee and reduce pain. Preventing knee stiffness. How far you can move your knee, keeping it there or making it farther. Losing weight (if this applies) to take pressure off your knee, lower your risk for injury, and make it easier for you to exercise. Your provider will  help you make an exercise program that fits your needs and physical abilities. Below are simple, low-impact exercises you can do at home. Ask your provider or physical therapist how often you should do your exercise program and how many times to repeat each exercise. General safety tips  Get your provider's approval before doing any exercises. Start slowly and stop any time you feel pain. Do not exercise if your knee pain is flaring up. Warm up first. Stretching a cold muscle can cause an injury. Do 5-10 minutes of easy movement or light stretching before beginning your exercises. Do 5-10 minutes of low-impact activity (like walking or cycling) before starting strengthening exercises. Contact your provider any time you have pain during or after exercising. Exercise can cause discomfort but should not be painful. It is normal to be a little stiff or sore after exercising. Stretching and range-of-motion exercises Front thigh stretch  Stand up straight and support your body by holding on to a chair or resting one hand on a wall. With your legs straight and close together, bend one knee to lift your heel up toward your butt. Using one hand for support, grab your ankle with your free hand. Pull your foot up closer toward your butt to feel the stretch in front of your thigh. Hold the stretch for 30  seconds. Repeat __________ times. Complete this exercise __________ times a day. Back thigh stretch  Sit on the floor with your back straight and your legs out straight in front of you. Place the palms of your hands on the floor and slide them toward your feet as you bend at the hip. Try to touch your nose to your knees and feel the stretch in the back of your thighs. Hold for 30 seconds. Repeat __________ times. Complete this exercise __________ times a day. Calf stretch  Stand facing a wall. Place the palms of your hands flat against the wall, arms extended, and lean slightly against the wall. Get into a lunge position with one leg bent at the knee and the other leg stretched out straight behind you. Keep both feet facing the wall and increase the bend in your knee while keeping the heel of the other leg flat on the ground. You should feel the stretch in your calf. Hold for 30 seconds. Repeat __________ times. Complete this exercise __________ times a day. Strengthening exercises Straight leg lift  Lie on your back with one knee bent and the other leg out straight. Slowly lift the straight leg without bending the knee. Lift until your foot is about 12 inches (30 cm) off the floor. Hold for 3-5 seconds and slowly lower your leg. Repeat __________ times. Complete this exercise __________ times a day. Single leg dip  Stand between two chairs and put both hands on the backs of the chairs for support. Extend one leg out straight with your body weight resting on the heel of the standing leg. Slowly bend your standing knee to dip your body to the level that is comfortable for you. Hold for 3-5 seconds. Repeat __________ times. Complete this exercise __________ times a day. Hamstring curls  Stand straight, knees close together, facing the back of a chair. Hold on to the back of a chair with both hands. Keep one leg straight. Bend the other knee while bringing the heel up toward the butt  until the knee is bent at a 90-degree angle (right angle). Hold for 3-5 seconds. Repeat __________ times. Complete this exercise __________ times  a day. Wall squat  Stand straight with your back, hips, and head against a wall. Step forward one foot at a time with your back still against the wall. Your feet should be 2 feet (61 cm) from the wall at shoulder width. Keeping your back, hips, and head against the wall, slide down the wall to as close to a sitting position as you can get. Hold for 5-10 seconds, then slowly slide back up. Repeat __________ times. Complete this exercise __________ times a day. Step-ups  Stand in front of a sturdy platform or stool that is about 6 inches (15 cm) high. Slowly step up with your left / right foot, keeping your knee in line with your hip and foot. Do not let your knee bend so far that you cannot see your toes. Hold on to a chair for balance, but do not use it for support. Slowly unlock your knee and lower yourself to the starting position. Repeat __________ times. Complete this exercise __________ times a day. Contact a health care provider if: Your exercises cause pain. Your pain is worse after you exercise. Your pain prevents you from doing your exercises. This information is not intended to replace advice given to you by your health care provider. Make sure you discuss any questions you have with your health care provider. Document Revised: 06/17/2022 Document Reviewed: 06/17/2022 Elsevier Patient Education  2024 [employer].

## 2022-12-29 LAB — COMPLETE METABOLIC PANEL WITH GFR
AG Ratio: 1.2 (calc) (ref 1.0–2.5)
ALT: 16 U/L (ref 6–29)
AST: 13 U/L (ref 10–30)
Albumin: 3.9 g/dL (ref 3.6–5.1)
Alkaline phosphatase (APISO): 95 U/L (ref 31–125)
BUN: 11 mg/dL (ref 7–25)
CO2: 27 mmol/L (ref 20–32)
Calcium: 8.9 mg/dL (ref 8.6–10.2)
Chloride: 104 mmol/L (ref 98–110)
Creat: 0.86 mg/dL (ref 0.50–0.96)
Globulin: 3.3 g/dL (calc) (ref 1.9–3.7)
Glucose, Bld: 86 mg/dL (ref 65–99)
Potassium: 3.9 mmol/L (ref 3.5–5.3)
Sodium: 140 mmol/L (ref 135–146)
Total Bilirubin: 0.3 mg/dL (ref 0.2–1.2)
Total Protein: 7.2 g/dL (ref 6.1–8.1)
eGFR: 96 mL/min/{1.73_m2} (ref >=60)

## 2022-12-29 LAB — SEDIMENTATION RATE: Sed Rate: 38 mm/h — ABNORMAL HIGH (ref 0–20)

## 2022-12-29 LAB — CBC WITH DIFFERENTIAL/PLATELET
Absolute Monocytes: 400 cells/uL (ref 200–950)
Basophils Absolute: 46 cells/uL (ref 0–200)
Basophils Relative: 0.6 %
Eosinophils Absolute: 162 cells/uL (ref 15–500)
Eosinophils Relative: 2.1 %
HCT: 39.1 % (ref 35.0–45.0)
Hemoglobin: 12.7 g/dL (ref 11.7–15.5)
Lymphs Abs: 2171 cells/uL (ref 850–3900)
MCH: 25.9 pg — ABNORMAL LOW (ref 27.0–33.0)
MCHC: 32.5 g/dL (ref 32.0–36.0)
MCV: 79.8 fL — ABNORMAL LOW (ref 80.0–100.0)
MPV: 9.7 fL (ref 7.5–12.5)
Monocytes Relative: 5.2 %
Neutro Abs: 4920 cells/uL (ref 1500–7800)
Neutrophils Relative %: 63.9 %
Platelets: 350 10*3/uL (ref 140–400)
RBC: 4.9 10*6/uL (ref 3.80–5.10)
RDW: 13.7 % (ref 11.0–15.0)
Total Lymphocyte: 28.2 %
WBC: 7.7 10*3/uL (ref 3.8–10.8)

## 2022-12-29 LAB — ANTI-NUCLEAR AB-TITER (ANA TITER): ANA Titer 1: 1:80 {titer} — ABNORMAL HIGH

## 2022-12-29 LAB — RNP ANTIBODY: Ribonucleic Protein(ENA) Antibody, IgG: 1.6 AI — AB

## 2022-12-29 LAB — ANTI-DNA ANTIBODY, DOUBLE-STRANDED: ds DNA Ab: 10 IU/mL — ABNORMAL HIGH

## 2022-12-29 LAB — SJOGRENS SYNDROME-A EXTRACTABLE NUCLEAR ANTIBODY: SSA (Ro) (ENA) Antibody, IgG: <1 AI

## 2022-12-29 LAB — C3 AND C4
C3 Complement: 185 mg/dL (ref 83–193)
C4 Complement: 25 mg/dL (ref 15–57)

## 2022-12-29 LAB — PROTEIN / CREATININE RATIO, URINE
Creatinine, Urine: 188 mg/dL (ref 20–275)
Protein/Creat Ratio: 85 mg/g creat (ref 24–184)
Protein/Creatinine Ratio: 0.085 mg/mg creat (ref 0.024–0.184)
Total Protein, Urine: 16 mg/dL (ref 5–24)

## 2022-12-29 LAB — ANA: Anti Nuclear Antibody (ANA): POSITIVE — AB

## 2022-12-31 NOTE — Progress Notes (Signed)
Double-stranded DNA is elevated but better titer.  RNP test positive.  ANA is low titer positive and stable.  CBC is stable.  Sed rate 38 which is mildly elevated.  Urine protein creatinine ratio normal, CMP normal SSA antibody negative, complements normal.  Labs are stable no change in treatment advised.

## 2023-05-15 NOTE — Progress Notes (Unsigned)
Office Visit Note  Patient: Tiffany Mercer             Date of Birth: 11/07/1995           MRN: [id number]             PCP: Tiffany Mercer., MD Referring: Tiffany Mercer.,* Visit Date: 05/29/2023 Occupation: @GUAROCC @  Subjective:     History of Present Illness: Tiffany Mercer is a 27 y.o. female with history of systemic lupus erythematosus. Patient remains on plaquenil 200 mg 1 tablet by mouth twice daily.     PLQ Eye Exam:12/26/2022 WNL @ [facility] Follow up 1 year  CBC and CMP updated on 12/26/22.  Orders for CBC and CMP released today.   Lab work from 12/26/22 was reviewed today in the office: ANA 1:80 nucleare fine speckled, complements WNL, dsDNA 10, ESR 38, RNP 1.6, Ro<1.  The following lab work will be updated today.     Activities of Daily Living:  Patient reports morning stiffness for *** {minute/hour:19697}.   Patient {ACTIONS;DENIES/REPORTS:21021675::"Denies"} nocturnal pain.  Difficulty dressing/grooming: {ACTIONS;DENIES/REPORTS:21021675::"Denies"} Difficulty climbing stairs: {ACTIONS;DENIES/REPORTS:21021675::"Denies"} Difficulty getting out of chair: {ACTIONS;DENIES/REPORTS:21021675::"Denies"} Difficulty using hands for taps, buttons, cutlery, and/or writing: {ACTIONS;DENIES/REPORTS:21021675::"Denies"}  No Rheumatology ROS completed.   PMFS History:  Patient Active Problem List   Diagnosis Date Noted   Family history of autoimmune disorder 11/17/2018   Autoimmune disease (HCC) 11/17/2018   History of iron deficiency anemia 11/09/2018   History of anxiety 11/09/2018    Past Medical History:  Diagnosis Date   Autoimmune disease (HCC)     Family History  Problem Relation Age of Onset   Fibromyalgia Mother    Raynaud syndrome Mother    Sjogren's syndrome Mother    Past Surgical History:  Procedure Laterality Date   CHOLECYSTECTOMY  2016   WISDOM TOOTH EXTRACTION  2018   Social History   Social History Narrative    Not on file   Immunization History  Administered Date(s) Administered   Moderna Covid-19 Vaccine Bivalent Booster 67yrs & up 06/21/2021   Moderna Sars-Covid-2 Vaccination 10/02/2019, 10/30/2019, 05/28/2020     Objective: Vital Signs: There were no vitals taken for this visit.   Physical Exam Vitals and nursing note reviewed.  Constitutional:      Appearance: She is well-developed.  HENT:     Head: Normocephalic and atraumatic.  Eyes:     Conjunctiva/sclera: Conjunctivae normal.  Cardiovascular:     Rate and Rhythm: Normal rate and regular rhythm.     Heart sounds: Normal heart sounds.  Pulmonary:     Effort: Pulmonary effort is normal.     Breath sounds: Normal breath sounds.  Abdominal:     General: Bowel sounds are normal.     Palpations: Abdomen is soft.  Musculoskeletal:     Cervical back: Normal range of motion.  Lymphadenopathy:     Cervical: No cervical adenopathy.  Skin:    General: Skin is warm and dry.     Capillary Refill: Capillary refill takes less than 2 seconds.  Neurological:     Mental Status: She is alert and oriented to person, place, and time.  Psychiatric:        Behavior: Behavior normal.      Musculoskeletal Exam: ***  CDAI Exam: CDAI Score: -- Patient Global: --; Provider Global: -- Swollen: --; Tender: -- Joint Exam 05/29/2023   No joint exam has been documented for this visit   There is currently no information  documented on the homunculus. Go to the Rheumatology activity and complete the homunculus joint exam.  Investigation: No additional findings.  Imaging: No results found.  Recent Labs: Lab Results  Component Value Date   WBC 7.7 12/26/2022   HGB 12.7 12/26/2022   PLT 350 12/26/2022   NA 140 12/26/2022   K 3.9 12/26/2022   CL 104 12/26/2022   CO2 27 12/26/2022   GLUCOSE 86 12/26/2022   BUN 11 12/26/2022   CREATININE 0.86 12/26/2022   BILITOT 0.3 12/26/2022   ALKPHOS 68 09/22/2019   AST 13 12/26/2022   ALT 16  12/26/2022   PROT 7.2 12/26/2022   ALBUMIN 4.2 09/22/2019   CALCIUM 8.9 12/26/2022   GFRAA 135 09/03/2020    Speciality Comments: PLQ Eye Exam:12/26/2022 WNL @ [facility] Follow up 1 year    Procedures:  No procedures performed Allergies: Other   Assessment / Plan:     Visit Diagnoses: Other organ or system involvement in systemic lupus erythematosus (HCC)  Anti-RNP antibodies present  High risk medication use  Chronic midline low back pain without sciatica  Chronic pain of both knees  Primary insomnia  Other fatigue  History of iron deficiency anemia  History of anxiety  Family history of autoimmune disorder  Orders: No orders of the defined types were placed in this encounter.  No orders of the defined types were placed in this encounter.   Face-to-face time spent with patient was *** minutes. Greater than 50% of time was spent in counseling and coordination of care.  Follow-Up Instructions: No follow-ups on file.   Tiffany Bienenstock, PA-C  Note - This record has been created using Dragon software.  Chart creation errors have been sought, but may not always  have been located. Such creation errors do not reflect on  the standard of medical care.

## 2023-05-25 ENCOUNTER — Other Ambulatory Visit: Payer: Self-pay | Admitting: Rheumatology

## 2023-05-25 DIAGNOSIS — M3219 Other organ or system involvement in systemic lupus erythematosus: Secondary | ICD-10-CM

## 2023-05-25 NOTE — Telephone Encounter (Signed)
Last Fill: 12/26/2022  Eye exam: 12/26/2022 WNL    Labs: 12/26/2022 Double-stranded DNA is elevated but better titer.  RNP test positive.  ANA is low titer positive and stable.  CBC is stable.  Sed rate 38 which is mildly elevated.  Urine protein creatinine ratio normal, CMP normal SSA antibody negative, complements normal.  Labs are stable no change in treatment advised.   Next Visit: 05/29/2023  Last Visit: 12/26/2022  DX: Other organ or system involvement in systemic lupus erythematosus   Current Dose per office note 12/26/2022: Plaquenil 200 mg 1 tablet by mouth twice daily.   Okay to refill Plaquenil?

## 2023-05-29 ENCOUNTER — Ambulatory Visit: Payer: Managed Care, Other (non HMO) | Admitting: Physician Assistant

## 2023-05-29 DIAGNOSIS — Z832 Family history of diseases of the blood and blood-forming organs and certain disorders involving the immune mechanism: Secondary | ICD-10-CM

## 2023-05-29 DIAGNOSIS — M3219 Other organ or system involvement in systemic lupus erythematosus: Secondary | ICD-10-CM

## 2023-05-29 DIAGNOSIS — M545 Low back pain, unspecified: Secondary | ICD-10-CM

## 2023-05-29 DIAGNOSIS — F5101 Primary insomnia: Secondary | ICD-10-CM

## 2023-05-29 DIAGNOSIS — R768 Other specified abnormal immunological findings in serum: Secondary | ICD-10-CM

## 2023-05-29 DIAGNOSIS — Z862 Personal history of diseases of the blood and blood-forming organs and certain disorders involving the immune mechanism: Secondary | ICD-10-CM

## 2023-05-29 DIAGNOSIS — Z79899 Other long term (current) drug therapy: Secondary | ICD-10-CM

## 2023-05-29 DIAGNOSIS — G8929 Other chronic pain: Secondary | ICD-10-CM

## 2023-05-29 DIAGNOSIS — Z8659 Personal history of other mental and behavioral disorders: Secondary | ICD-10-CM

## 2023-05-29 DIAGNOSIS — R5383 Other fatigue: Secondary | ICD-10-CM
